# Patient Record
Sex: Male | Born: 1955
Health system: Southern US, Community
[De-identification: ages and names within clinical notes are randomized; demographics above are authoritative.]

## PROBLEM LIST (undated history)

## (undated) DIAGNOSIS — E785 Hyperlipidemia, unspecified: Secondary | ICD-10-CM

## (undated) DIAGNOSIS — M1990 Unspecified osteoarthritis, unspecified site: Secondary | ICD-10-CM

## (undated) DIAGNOSIS — M2550 Pain in unspecified joint: Secondary | ICD-10-CM

## (undated) DIAGNOSIS — Z87442 Personal history of urinary calculi: Secondary | ICD-10-CM

## (undated) DIAGNOSIS — R7303 Prediabetes: Secondary | ICD-10-CM

## (undated) DIAGNOSIS — U071 COVID-19: Secondary | ICD-10-CM

## (undated) DIAGNOSIS — F419 Anxiety disorder, unspecified: Secondary | ICD-10-CM

## (undated) DIAGNOSIS — I10 Essential (primary) hypertension: Secondary | ICD-10-CM

## (undated) DIAGNOSIS — K219 Gastro-esophageal reflux disease without esophagitis: Secondary | ICD-10-CM

## (undated) DIAGNOSIS — N200 Calculus of kidney: Secondary | ICD-10-CM

## (undated) DIAGNOSIS — I519 Heart disease, unspecified: Secondary | ICD-10-CM

## (undated) HISTORY — DX: Essential (primary) hypertension: I10

## (undated) HISTORY — PX: COLONOSCOPY: SHX174

## (undated) HISTORY — DX: Pain in unspecified joint: M25.50

## (undated) HISTORY — DX: Calculus of kidney: N20.0

## (undated) HISTORY — DX: Unspecified osteoarthritis, unspecified site: M19.90

## (undated) HISTORY — DX: Heart disease, unspecified: I51.9

## (undated) HISTORY — DX: Gastro-esophageal reflux disease without esophagitis: K21.9

## (undated) HISTORY — DX: Hyperlipidemia, unspecified: E78.5

---

## 1996-04-25 HISTORY — PX: UPPER GI ENDOSCOPY: SHX6162

## 2009-07-15 ENCOUNTER — Ambulatory Visit: Payer: Self-pay | Admitting: General Practice

## 2009-07-26 ENCOUNTER — Ambulatory Visit: Payer: Self-pay | Admitting: General Practice

## 2009-07-26 DIAGNOSIS — M47814 Spondylosis without myelopathy or radiculopathy, thoracic region: Secondary | ICD-10-CM | POA: Insufficient documentation

## 2009-11-09 ENCOUNTER — Encounter: Payer: Self-pay | Admitting: Neurosurgery

## 2009-11-27 ENCOUNTER — Encounter: Payer: Self-pay | Admitting: Neurosurgery

## 2009-12-28 ENCOUNTER — Encounter: Payer: Self-pay | Admitting: Neurosurgery

## 2011-12-16 ENCOUNTER — Ambulatory Visit: Payer: Self-pay | Admitting: General Practice

## 2011-12-16 LAB — CREATININE, SERUM
Creatinine: 1.01 mg/dL (ref 0.60–1.30)
EGFR (African American): >60
EGFR (Non-African Amer.): >60

## 2011-12-19 ENCOUNTER — Ambulatory Visit: Payer: Self-pay | Admitting: General Practice

## 2012-06-22 ENCOUNTER — Ambulatory Visit: Payer: Self-pay | Admitting: Gastroenterology

## 2013-11-07 IMAGING — CT CT ANGIOGRAPHY HEAD
1 series · 1 of 1 positions shown · non-contrast
Comparison: none

REASON FOR EXAM: LABS 1ST abn MRIs headaches occipital follow up possible
aneurysm right ante...
COMMENTS:

[Series 7: bw · 1 of 1 slices shown]
[im 1/1]
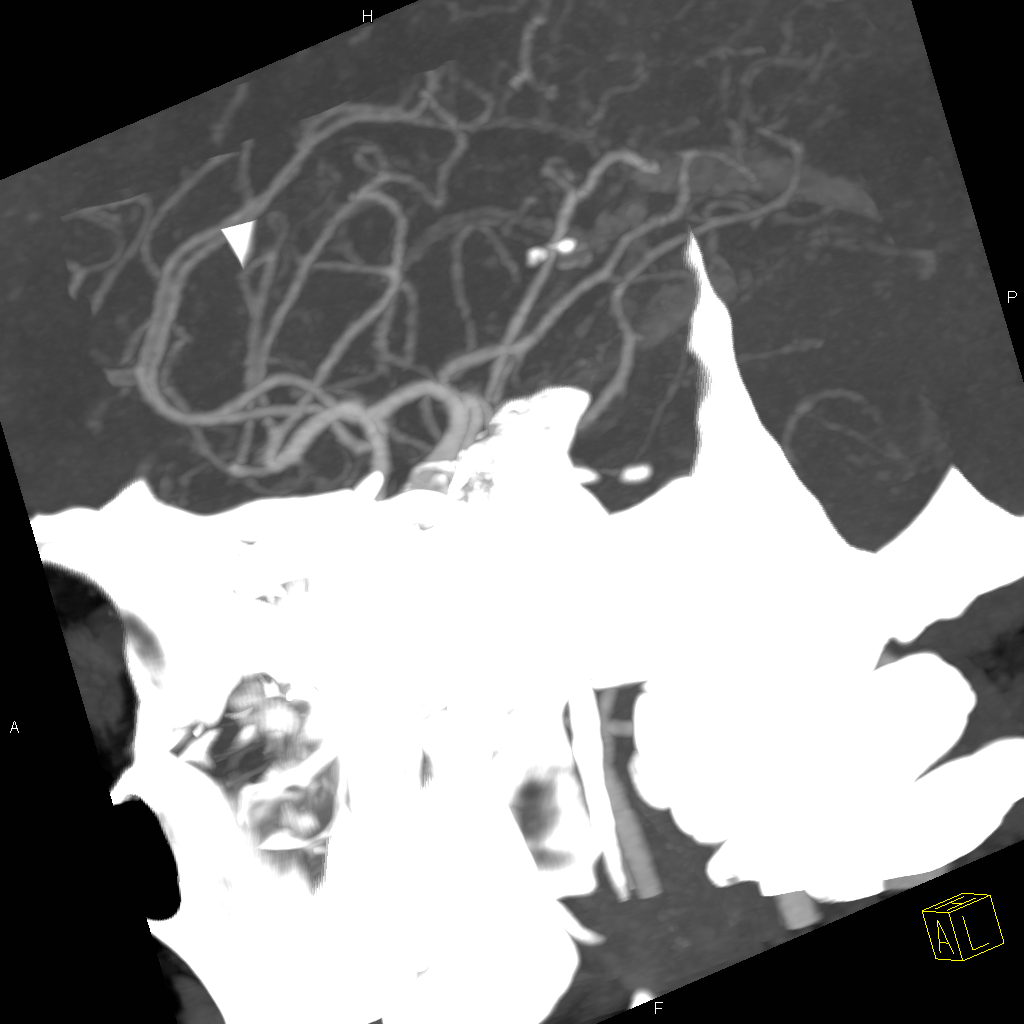

[1 of 1 positions shown; findings below may reference images not displayed]

PROCEDURE:     KCT - KCT ANGIOGRAPHY HEAD W/WO  - December 19, 2011  [DATE]

RESULT:     CT angiography was performed through the brain following
administration of 100 cc of Isovue 370. Multiplanar imaging review performed
on the VIA monitor.

The A1 segment of the right anterior cerebral artery is hypoplastic as
previously demonstrated on the MRI of 16 December, 2011. The right A2 segment
originates from the distal aspect of the left A1 segment. At the origin
there is no evidence of an aneurysm. The origin of the left A2 segment
appears normal. Elsewhere the intracranial arterial system exhibits no
evidence of aneurysm. Once again the left posterior communicating artery is
demonstrated but the right posterior communicating artery is not clearly
evident. The basilar artery appears patent. The distal internal carotid
arteries are normal in appearance.
IMPRESSION: There is no definite evidence of an aneurysm at the origin
of the A2 segment of the right anterior cerebral artery. This artery arises
from the left A1 trunk artery as does the normal-appearing left A2 segment.

## 2014-07-15 ENCOUNTER — Encounter: Payer: Self-pay | Admitting: *Deleted

## 2014-07-28 ENCOUNTER — Encounter: Payer: Self-pay | Admitting: General Surgery

## 2014-07-28 ENCOUNTER — Ambulatory Visit (INDEPENDENT_AMBULATORY_CARE_PROVIDER_SITE_OTHER): Payer: BLUE CROSS/BLUE SHIELD | Admitting: General Surgery

## 2014-07-28 VITALS — BP 124/70 | HR 78 | Resp 14 | Ht 74.0 in | Wt 239.0 lb

## 2014-07-28 DIAGNOSIS — K429 Umbilical hernia without obstruction or gangrene: Secondary | ICD-10-CM

## 2014-07-28 NOTE — Progress Notes (Signed)
Patient ID: Jose HallKevin B Spera, male   DOB: [birth year], 59 y.o.   MRN: [id number]  Chief Complaint  Patient presents with  . Other    evaluation for umbilical hernia    HPI Jose HolmKevin B Crispo is a 59 y.o. male who presents for an evaluation of an umbilical hernia. The patient states the hernia has been present for approximately 2-3 years. He states he has a burning sensation that comes and goes on a daily basis. He notices it when he bends. No GI issues. He hasn't noticed much of a change in size over time, although he is more aware of the area in the last 6 months when he bends at the waist.  HPI  Past Medical History  Diagnosis Date  . Hypertension   . Hyperlipidemia   . Arthritis   . Joint pain   . Kidney stone   . Heart disease   . GERD (gastroesophageal reflux disease)     Past Surgical History  Procedure Laterality Date  . Upper gi endoscopy  04/25/1996  . Colonoscopy  2013-2014    Family History  Problem Relation Age of Onset  . Diabetes Mother   . Heart disease Father     Social History History  Substance Use Topics  . Smoking status: Never Smoker   . Smokeless tobacco: Never Used  . Alcohol Use: Yes    No Known Allergies  Current Outpatient Prescriptions  Medication Sig Dispense Refill  . atorvastatin (LIPITOR) 20 MG tablet Take 20 mg by mouth daily.    Marland Kitchen. lisinopril-hydrochlorothiazide (PRINZIDE,ZESTORETIC) 20-25 MG per tablet Take 1 tablet by mouth daily.    . meloxicam (MOBIC) 15 MG tablet Take 15 mg by mouth daily as needed for pain.    . Multiple Vitamin (MULTIVITAMIN) tablet Take 1 tablet by mouth daily.    Marland Kitchen. omeprazole (PRILOSEC) 20 MG capsule Take 20 mg by mouth daily.    Marland Kitchen. terbinafine (LAMISIL) 250 MG tablet Take 250 mg by mouth daily.     No current facility-administered medications for this visit.    Review of Systems Review of Systems  Constitutional: Negative.   Respiratory: Negative.   Cardiovascular: Negative.     Blood pressure 124/70, pulse  78, resp. rate 14, height 6\' 2"  (1.88 m), weight 239 lb (108.41 kg).  Physical Exam Physical Exam  Constitutional: He is oriented to person, place, and time. He appears well-developed and well-nourished.  Cardiovascular: Normal rate, regular rhythm and normal heart sounds.   No murmur heard. Pulmonary/Chest: Effort normal and breath sounds normal.  Abdominal: Soft. Normal appearance and bowel sounds are normal. There is no hepatosplenomegaly. There is no tenderness. A hernia (1 cm defect reducible umbilical hernia present.) is present.  Neurological: He is alert and oriented to person, place, and time.  Skin: Skin is warm and dry.    Data Reviewed PCP notes of 11 9 and 04/09/2014.  Laboratory studies dated November and 9, 2015 showed normal electrolytes and creatinine of 0.9 with an estimated GFR of 95. Elevated serum triglycerides. PSA normal at 1.8. Hemoglobin 16.7. White blood cell, 4300. Platelets low at 136,000.    Assessment    Umbilical hernia, modestly symptomatic. Mild thrombocytopenia.    Plan    [facility] We will recheck his platelet count prior to surgery.    PCP:  Leigh AuroraStrickland, James   Roye Gustafson W 07/29/2014, 8:31 AM

## 2014-07-28 NOTE — Patient Instructions (Addendum)
Patient to be scheduled for umbilical hernia repair. The patient is aware to call back for any questions or concerns.  Open Hernia Repair Open hernia repair is surgery to fix a hernia. A hernia occurs when an internal organ or tissue pushes out through a weak spot in the abdominal wall muscles. Hernias commonly occur in the groin and around the navel. Most hernias tend to get worse over time. Surgery is often done to prevent the hernia from getting bigger, becoming uncomfortable, or becoming an emergency. Emergency surgery Krizan be needed if abdominal contents get stuck in the opening (incarcerated hernia) or the blood supply gets cut off (strangulated hernia). In an open repair, a large cut (incision) is made in the abdomen to perform the surgery. LET [facility] CARE PROVIDER KNOW ABOUT:  Any allergies you have.  All medicines you are taking, including vitamins, herbs, eye drops, creams, and over-the-counter medicines.  Previous problems you or members of your family have had with the use of anesthetics.  Any blood disorders you have.  Previous surgeries you have had.  Medical conditions you have. RISKS AND COMPLICATIONS Generally, this is a safe procedure. However, as with any procedure, complications can occur. Possible complications include:  Infection.  Bleeding.  Nerve injury.  Chronic pain.  The hernia can come back.  Injury to the intestines. BEFORE THE PROCEDURE  Ask your health care provider about changing or stopping any regular medicines. Avoid taking aspirin or blood thinners as directed by your health care provider.  Do noteat or drink anything after midnight the night before surgery.  If you smoke, do not smoke for at least 2 weeks before your surgery.  Do not drink alcohol the day before your surgery.  Let your health care provider know if you develop a cold or any infection before your surgery.  Arrange for someone to drive you home after the procedure or  after your hospital stay. Also arrange for someone to help you with activities during recovery. PROCEDURE   Small monitors will be put on your body. They are used to check your heart, blood pressure, and oxygen level.   An IV access tube will be put into one of your veins. Medicine will be able to flow directly into your body through this IV tube.   You might be given a medicine to help you relax (sedative).   You will be given a medicine to make you sleep (general anesthetic). A breathing tube Ruperto be placed into your lungs during the procedure.  A cut (incision) is made over the hernia defect, and the contents are pushed back into the abdomen.  If the hernia is small, stitches Wyrick be used to bring the muscle edges back together.  Typically, a surgeon will place a mesh patch made of man-made material (synthetic) to cover the defect. The mesh is sewn to healthy muscle. This reduces the risk of the hernia coming back.  The tissue and skin over the hernia are then closed with stitches or staples.  If the hernia was large, a drain Hay be left in place to collect excess fluid where the hernia used to be.  Bandages (dressings) are used to cover the incision. AFTER THE PROCEDURE  You will be taken to a recovery area where your progress will be monitored.  If the hernia was small or in the groin (inguinal) region, you will likely be allowed to go home once you are awake, stable, and taking fluids well.  If the hernia  was large, you Mclucas have to wait for your bowel function to return. You Ingham need to stay in the hospital for 2-3 days until you can eat and your pain is controlled. A drain Wiltgen be left in place for 5-7 days. You will be taught how to care for the drain. Document Released: 11/09/2000 Document Revised: 03/06/2013 Document Reviewed: 12/26/2012 [facility] Patient Information 2015 [employer] [place]. This information is not intended to replace advice given to you by your health care  provider. Make sure you discuss any questions you have with your health care provider.  [facility].

## 2014-07-29 ENCOUNTER — Other Ambulatory Visit: Payer: Self-pay | Admitting: General Surgery

## 2014-07-29 DIAGNOSIS — K429 Umbilical hernia without obstruction or gangrene: Secondary | ICD-10-CM | POA: Insufficient documentation

## 2014-08-11 ENCOUNTER — Ambulatory Visit: Payer: Self-pay

## 2014-08-11 ENCOUNTER — Encounter: Payer: Self-pay | Admitting: General Surgery

## 2014-08-14 ENCOUNTER — Ambulatory Visit: Payer: Self-pay | Admitting: General Surgery

## 2014-08-14 DIAGNOSIS — K429 Umbilical hernia without obstruction or gangrene: Secondary | ICD-10-CM | POA: Diagnosis not present

## 2014-08-14 HISTORY — PX: HERNIA REPAIR: SHX51

## 2014-08-15 ENCOUNTER — Encounter: Payer: Self-pay | Admitting: General Surgery

## 2014-08-15 ENCOUNTER — Telehealth: Payer: Self-pay | Admitting: *Deleted

## 2014-08-15 NOTE — Telephone Encounter (Signed)
Pt called and wanted to know if he could ride all the way to [place] because he had a death in the family. He had umbilical hernia yesterday (08/14/14). Spoke with Shanda BumpsJessica and she said for him to get out about every 30-45 minutes to walk.

## 2014-08-21 ENCOUNTER — Ambulatory Visit (INDEPENDENT_AMBULATORY_CARE_PROVIDER_SITE_OTHER): Payer: BLUE CROSS/BLUE SHIELD | Admitting: General Surgery

## 2014-08-21 ENCOUNTER — Encounter: Payer: Self-pay | Admitting: General Surgery

## 2014-08-21 VITALS — BP 142/74 | HR 76 | Resp 12 | Ht 74.0 in | Wt 239.0 lb

## 2014-08-21 DIAGNOSIS — K429 Umbilical hernia without obstruction or gangrene: Secondary | ICD-10-CM

## 2014-08-21 NOTE — Patient Instructions (Addendum)
Proper lifting techniques reviewed.Patient to return in two weeks.  

## 2014-08-21 NOTE — Progress Notes (Signed)
Patient ID: Jose Moody, male   DOB: [birth year], 59 y.o.   MRN: [id number]  Chief Complaint  Patient presents with  . Routine Post Op    umbilical hernia repair    HPI Jose Moody is a 59 y.o. here today for his post op umbilical hernia repair done on 08/14/14. Patient states he is doing well.  HPI  Past Medical History  Diagnosis Date  . Hypertension   . Hyperlipidemia   . Arthritis   . Joint pain   . Kidney stone   . Heart disease   . GERD (gastroesophageal reflux disease)     Past Surgical History  Procedure Laterality Date  . Upper gi endoscopy  04/25/1996  . Colonoscopy  2013-2014  . Hernia repair  08/14/14     umbilical hernia    Family History  Problem Relation Age of Onset  . Diabetes Mother   . Heart disease Father     Social History History  Substance Use Topics  . Smoking status: Never Smoker   . Smokeless tobacco: Never Used  . Alcohol Use: Yes    No Known Allergies  Current Outpatient Prescriptions  Medication Sig Dispense Refill  . atorvastatin (LIPITOR) 20 MG tablet Take 20 mg by mouth daily.    Marland Kitchen. lisinopril-hydrochlorothiazide (PRINZIDE,ZESTORETIC) 20-25 MG per tablet Take 1 tablet by mouth daily.    . meloxicam (MOBIC) 15 MG tablet Take 15 mg by mouth daily as needed for pain.    . Multiple Vitamin (MULTIVITAMIN) tablet Take 1 tablet by mouth daily.    Marland Kitchen. omeprazole (PRILOSEC) 20 MG capsule Take 20 mg by mouth daily.    Marland Kitchen. terbinafine (LAMISIL) 250 MG tablet Take 250 mg by mouth daily.     No current facility-administered medications for this visit.    Review of Systems Review of Systems  Constitutional: Negative.   Respiratory: Negative.   Cardiovascular: Negative.     Blood pressure 142/74, pulse 76, resp. rate 12, height 6\' 2"  (1.88 m), weight 239 lb (108.41 kg).  Physical Exam Physical Exam  Constitutional: He is oriented to person, place, and time. He appears well-developed and well-nourished.  Abdominal: Soft. Normal appearance.     Umbilical hernia incision is clean and healing well.   Neurological: He is alert and oriented to person, place, and time.  Skin: Skin is warm and dry.      Assessment    Study progress status post primary repair of small umbilical defect.    Plan    The patient op rate 70 equipment. Plan for a reexamination in 2 weeks. In the interval the patient will increase his activity as tolerated. Proper lifting technique was reviewed. Anticipate return to work in 3 weeks.     PCP:  Jose Moody  Louvenia Golomb W 08/21/2014, 10:14 AM

## 2014-09-04 ENCOUNTER — Encounter: Payer: Self-pay | Admitting: General Surgery

## 2014-09-04 ENCOUNTER — Ambulatory Visit (INDEPENDENT_AMBULATORY_CARE_PROVIDER_SITE_OTHER): Payer: BLUE CROSS/BLUE SHIELD | Admitting: General Surgery

## 2014-09-04 VITALS — BP 130/70 | HR 82 | Resp 14 | Ht 74.0 in | Wt 239.0 lb

## 2014-09-04 DIAGNOSIS — K429 Umbilical hernia without obstruction or gangrene: Secondary | ICD-10-CM

## 2014-09-04 NOTE — Patient Instructions (Signed)
Patient to return as needed. 

## 2014-09-04 NOTE — Progress Notes (Signed)
Patient ID: Doloris HallKevin B Moody, male   DOB: 10/26/1955, 59 y.o.   MRN: [id number]  Chief Complaint  Patient presents with  . Routine Post Op    umbilical hernia    HPI Jose Moody is a 59 y.o. male . here today for his post op umbilical hernia repair done on 08/14/14. Patient states he is doing well.  HPI  Past Medical History  Diagnosis Date  . Hypertension   . Hyperlipidemia   . Arthritis   . Joint pain   . Kidney stone   . Heart disease   . GERD (gastroesophageal reflux disease)     Past Surgical History  Procedure Laterality Date  . Upper gi endoscopy  04/25/1996  . Colonoscopy  2013-2014  . Hernia repair  08/14/14     umbilical hernia    Family History  Problem Relation Age of Onset  . Diabetes Mother   . Heart disease Father     Social History History  Substance Use Topics  . Smoking status: Never Smoker   . Smokeless tobacco: Never Used  . Alcohol Use: Yes    No Known Allergies  Current Outpatient Prescriptions  Medication Sig Dispense Refill  . atorvastatin (LIPITOR) 20 MG tablet Take 20 mg by mouth daily.    Marland Kitchen. lisinopril-hydrochlorothiazide (PRINZIDE,ZESTORETIC) 20-25 MG per tablet Take 1 tablet by mouth daily.    . meloxicam (MOBIC) 15 MG tablet Take 15 mg by mouth daily as needed for pain.    . Multiple Vitamin (MULTIVITAMIN) tablet Take 1 tablet by mouth daily.    Marland Kitchen. omeprazole (PRILOSEC) 20 MG capsule Take 20 mg by mouth daily.    Marland Kitchen. terbinafine (LAMISIL) 250 MG tablet Take 250 mg by mouth daily.     No current facility-administered medications for this visit.    Review of Systems Review of Systems  Constitutional: Negative.   Respiratory: Negative.   Cardiovascular: Negative.     Blood pressure 130/70, pulse 82, resp. rate 14, height 6\' 2"  (1.88 m), weight 239 lb (108.41 kg).  Physical Exam Physical Exam  Constitutional: He appears well-developed and well-nourished.  Eyes: Conjunctivae are normal. No scleral icterus.  Neck: Neck supple.   Abdominal:  Umbilical hernia looks clean and healing well.   Lymphadenopathy:    He has no cervical adenopathy.  Neurological: He is alert.  Skin: Skin is dry.     Assessment    Doing well status post umbilical hernia repair.    Plan    Patient to return as needed. [name] return back to work on 09/08/14. Care was strenuous lifting was encouraged. Proper lifting technique demonstrated. No restrictions.     PCP:  Leigh AuroraStrickland, James   Munir Victorian W 09/05/2014, 9:54 PM

## 2014-09-28 NOTE — Op Note (Signed)
PATIENT NAME:  Jose Moody, Jose Moody MR#:  [id number] DATE OF BIRTH:  November 19, 1955  DATE OF PROCEDURE:  08/14/2014  PREOPERATIVE DIAGNOSIS: Symptomatic umbilical hernia.   POSTOPERATIVE DIAGNOSIS: Symptomatic umbilical hernia.  PROCEDURE PERFORMED: Repair of umbilical hernia.   SURGEON: Earline MayotteJeffrey W. Denaly Gatling, MD   ANESTHESIA: General by LMA; Marcaine 0.5% plain, 30 mL local infiltration.   ESTIMATED BLOOD LOSS: Minimal.   CLINICAL NOTE: This 59 year old male has developed a slowly enlarging umbilical hernia. The site is symptomatic with direct pressure. He was admitted for elective repair.   OPERATIVE NOTE: Hair had previously been removed with clippers. Local anesthetic was infiltrated for postoperative analgesia. An infraumbilical incision was made and carried down through the skin and subcutaneous tissue with hemostasis achieved by electrocautery. The hernia sac was dissected free from the overlying umbilical skin and transected at the fascial layer with electrocautery. The undersurface of the fascia was cleared. The fascial defect was less than 2 cm in diameter. This was closed with interrupted 0 Surgilon sutures. The umbilical skin was tacked to the fascia with 3-0 Vicryl figure-of-eight suture. The adipose layer was approximated with running 3-0 Vicryl. The skin was closed with a running 4-0 Vicryl subcuticular suture. Benzoin, Steri-Strips, Telfa, and Tegaderm dressings were then applied. The patient tolerated the procedure well and was taken to the recovery room in stable condition.    ____________________________ Earline MayotteJeffrey W. Donette Mainwaring, MD jwb:bm D: 08/14/2014 21:16:43 ET T: 08/14/2014 22:24:39 ET JOB#: [id number]  cc: Earline MayotteJeffrey W. Kian Ottaviano, MD, <Dictator> Quita SkyeJames D. Dorothey BasemanStrickland, MD Charish Schroepfer Brion AlimentW Abigayl Hor MD ELECTRONICALLY SIGNED 08/16/2014 11:56

## 2015-07-20 ENCOUNTER — Other Ambulatory Visit: Payer: Self-pay | Admitting: Physician Assistant

## 2015-12-23 ENCOUNTER — Other Ambulatory Visit: Payer: Self-pay | Admitting: Physician Assistant

## 2016-01-12 ENCOUNTER — Other Ambulatory Visit: Payer: Self-pay | Admitting: Physician Assistant

## 2016-01-25 ENCOUNTER — Other Ambulatory Visit: Payer: Self-pay | Admitting: Physician Assistant

## 2016-07-14 ENCOUNTER — Other Ambulatory Visit: Payer: Self-pay | Admitting: Physician Assistant

## 2016-08-29 ENCOUNTER — Emergency Department: Payer: BLUE CROSS/BLUE SHIELD

## 2016-08-29 ENCOUNTER — Encounter: Payer: Self-pay | Admitting: *Deleted

## 2016-08-29 ENCOUNTER — Emergency Department
Admission: EM | Admit: 2016-08-29 | Discharge: 2016-08-30 | Disposition: A | Discharge status: Home / Self Care | Payer: BLUE CROSS/BLUE SHIELD | Attending: Emergency Medicine | Admitting: Emergency Medicine

## 2016-08-29 DIAGNOSIS — R109 Unspecified abdominal pain: Secondary | ICD-10-CM

## 2016-08-29 DIAGNOSIS — R319 Hematuria, unspecified: Secondary | ICD-10-CM

## 2016-08-29 DIAGNOSIS — I10 Essential (primary) hypertension: Secondary | ICD-10-CM | POA: Diagnosis not present

## 2016-08-29 DIAGNOSIS — Z79899 Other long term (current) drug therapy: Secondary | ICD-10-CM | POA: Insufficient documentation

## 2016-08-29 DIAGNOSIS — N200 Calculus of kidney: Secondary | ICD-10-CM | POA: Insufficient documentation

## 2016-08-29 LAB — CBC
HEMATOCRIT: 49.7 % (ref 40.0–52.0)
Hemoglobin: 17.1 g/dL (ref 13.0–18.0)
MCH: 31.2 pg (ref 26.0–34.0)
MCHC: 34.4 g/dL (ref 32.0–36.0)
MCV: 90.6 fL (ref 80.0–100.0)
PLATELETS: 136 10*3/uL — AB (ref 150–440)
RBC: 5.48 MIL/uL (ref 4.40–5.90)
RDW: 13.5 % (ref 11.5–14.5)
WBC: 14.6 10*3/uL — AB (ref 3.8–10.6)

## 2016-08-29 LAB — BASIC METABOLIC PANEL
ANION GAP: 10 (ref 5–15)
BUN: 24 mg/dL — ABNORMAL HIGH (ref 6–20)
CO2: 24 mmol/L (ref 22–32)
Calcium: 10 mg/dL (ref 8.9–10.3)
Chloride: 104 mmol/L (ref 101–111)
Creatinine, Ser: 1.61 mg/dL — ABNORMAL HIGH (ref 0.61–1.24)
GFR, EST AFRICAN AMERICAN: 53 mL/min — AB (ref >60)
GFR, EST NON AFRICAN AMERICAN: 46 mL/min — AB (ref >60)
GLUCOSE: 121 mg/dL — AB (ref 65–99)
POTASSIUM: 4.6 mmol/L (ref 3.5–5.1)
Sodium: 138 mmol/L (ref 135–145)

## 2016-08-29 LAB — URINALYSIS, COMPLETE (UACMP) WITH MICROSCOPIC
Bacteria, UA: NONE SEEN
Bilirubin Urine: NEGATIVE
GLUCOSE, UA: NEGATIVE mg/dL
Ketones, ur: NEGATIVE mg/dL
LEUKOCYTES UA: NEGATIVE
Nitrite: NEGATIVE
PH: 7 (ref 5.0–8.0)
PROTEIN: NEGATIVE mg/dL
SQUAMOUS EPITHELIAL / LPF: NONE SEEN
Specific Gravity, Urine: 1.016 (ref 1.005–1.030)

## 2016-08-29 MED ORDER — ONDANSETRON HCL 4 MG/2ML IJ SOLN
4.0000 mg | Freq: Once | INTRAMUSCULAR | Status: AC
  Administered 2016-08-29: 4 mg via INTRAVENOUS

## 2016-08-29 MED ORDER — HYDROMORPHONE HCL 1 MG/ML IJ SOLN
1.0000 mg | INTRAMUSCULAR | Status: AC
  Administered 2016-08-29: 1 mg via INTRAVENOUS
  Filled 2016-08-29: qty 1

## 2016-08-29 MED ORDER — IOPAMIDOL (ISOVUE-370) INJECTION 76%
100.0000 mL | Freq: Once | INTRAVENOUS | Status: AC | PRN
  Administered 2016-08-29: 100 mL via INTRAVENOUS

## 2016-08-29 MED ORDER — SODIUM CHLORIDE 0.9 % IV BOLUS (SEPSIS)
1000.0000 mL | Freq: Once | INTRAVENOUS | Status: AC
  Administered 2016-08-29: 1000 mL via INTRAVENOUS

## 2016-08-29 MED ORDER — MORPHINE SULFATE (PF) 4 MG/ML IV SOLN
4.0000 mg | Freq: Once | INTRAVENOUS | Status: AC
  Administered 2016-08-29: 4 mg via INTRAVENOUS

## 2016-08-29 MED ORDER — ONDANSETRON HCL 4 MG/2ML IJ SOLN
4.0000 mg | Freq: Once | INTRAMUSCULAR | Status: AC
  Administered 2016-08-29: 4 mg via INTRAVENOUS
  Filled 2016-08-29: qty 2

## 2016-08-29 MED ORDER — MORPHINE SULFATE (PF) 4 MG/ML IV SOLN
INTRAVENOUS | Status: AC
  Administered 2016-08-29: 4 mg via INTRAVENOUS
  Filled 2016-08-29: qty 1

## 2016-08-29 MED ORDER — ONDANSETRON HCL 4 MG/2ML IJ SOLN
INTRAMUSCULAR | Status: AC
  Administered 2016-08-29: 4 mg via INTRAVENOUS
  Filled 2016-08-29: qty 2

## 2016-08-29 NOTE — ED Provider Notes (Signed)
[facility] Emergency Department Provider Note   ____________________________________________   First MD Initiated Contact with Patient 08/29/16 2231     (approximate)  I have reviewed the triage vital signs and the nursing notes.   HISTORY  Chief Complaint Flank Pain    HPI Jose Moody is a 61 y.o. male reports that 6 PM had sudden onset of severe pain in the right lower quadrant which radiates up towards his right flank. Associated with nausea vomiting and severe 10 out 10 pain. Reports he cannot comfortable. Does feel somewhat similar to previous kidney stone, but denies any trouble urinating, blood in his urine, or burning with urination. He's also had a previous umbilical hernia repair, but does not see any swelling or pain in that area.  Denies any testicular or groin pain, except the pain seems to radiate out somewhat from his right flank towards the groin.  No chest pain or shortness of breath. No numbness tingling or weakness in the legs. No cold or blue feet.   Past Medical History:  Diagnosis Date  . Arthritis   . GERD (gastroesophageal reflux disease)   . Heart disease   . Hyperlipidemia   . Hypertension   . Joint pain   . Kidney stone     Patient Active Problem List   Diagnosis Date Noted  . Umbilical hernia without obstruction and without gangrene 07/29/2014    Past Surgical History:  Procedure Laterality Date  . COLONOSCOPY  2013-2014  . HERNIA REPAIR  08/14/14    umbilical hernia  . UPPER GI ENDOSCOPY  04/25/1996    Prior to Admission medications   Medication Sig Start Date End Date Taking? Authorizing Provider  atorvastatin (LIPITOR) 20 MG tablet Take 20 mg by mouth daily.    Historical Provider, MD  lisinopril-hydrochlorothiazide (PRINZIDE,ZESTORETIC) 20-25 MG per tablet Take 1 tablet by mouth daily.    Historical Provider, MD  meloxicam (MOBIC) 15 MG tablet Take 15 mg by mouth daily as needed for pain.    Historical  Provider, MD  Multiple Vitamin (MULTIVITAMIN) tablet Take 1 tablet by mouth daily.    Historical Provider, MD  omeprazole (PRILOSEC) 20 MG capsule Take 20 mg by mouth daily.    Historical Provider, MD  terbinafine (LAMISIL) 250 MG tablet Take 250 mg by mouth daily.    Historical Provider, MD    Allergies Patient has no known allergies.  Family History  Problem Relation Age of Onset  . Diabetes Mother   . Heart disease Father     Social History Social History  Substance Use Topics  . Smoking status: Never Smoker  . Smokeless tobacco: Never Used  . Alcohol use Yes    Review of Systems Constitutional: No fever/chills Eyes: No visual changes. ENT: No sore throat. Cardiovascular: Denies chest pain. Respiratory: Denies shortness of breath. Gastrointestinal: No diarrhea.  No constipation. Genitourinary: Negative for dysuria. Musculoskeletal: Negative for back painExcept some pain around his "right kidney". Skin: Negative for rash. Neurological: Negative for headaches, focal weakness or numbness.  10-point ROS otherwise negative.  ____________________________________________   PHYSICAL EXAM:  VITAL SIGNS: ED Triage Vitals  Enc Vitals Group     BP 08/29/16 2200 (!) 180/95     Pulse Rate 08/29/16 2151 77     Resp 08/29/16 2151 20     Temp 08/29/16 2151 99.6 F (37.6 C)     Temp Source 08/29/16 2151 Oral     SpO2 08/29/16 2151 100 %  Weight 08/29/16 2152 243 lb (110.2 kg)     Height 08/29/16 2152  (1.88 m)     Head Circumference --      Peak Flow --      Pain Score 08/29/16 2151 10     Pain Loc --      Pain Edu? --      Excl. in GC? --     Constitutional: Alert and oriented. Somewhat writhing in the bed, unable to comfortable. Does appear in significant pain. Eyes: Conjunctivae are normal. PERRL. EOMI. Head: Atraumatic. Nose: No congestion/rhinnorhea. Mouth/Throat: Mucous membranes are moist.  Oropharynx non-erythematous. Neck: No stridor.     Cardiovascular: Normal rate, regular rhythm. Grossly normal heart sounds.  Good peripheral circulation. Respiratory: Normal respiratory effort.  No retractions. Lungs CTAB. Gastrointestinal: Soft and nontender except in the right flank. He reports moderate tenderness. No CVA tenderness.. No distention. No umbilical hernia. No groin hernia or masses. Testicles nontender, no edema or erythema. Normal penis has not correct. No abdominal bruits.  Musculoskeletal: No lower extremity tenderness nor edema.  No joint effusions. Bilateral palpable dorsalis pedis pulses. Neurologic:  Normal speech and language. No gross focal neurologic deficits are appreciated.  Skin:  Skin is warm, dry and intact. No rash noted. Psychiatric: Mood and affect are normal. Speech and behavior are normal.  ____________________________________________   LABS (all labs ordered are listed, but only abnormal results are displayed)  Labs Reviewed  URINALYSIS, COMPLETE (UACMP) WITH MICROSCOPIC - Abnormal; Notable for the following:       Result Value   Color, Urine YELLOW (*)    APPearance HAZY (*)    Hgb urine dipstick MODERATE (*)    All other components within normal limits  CBC - Abnormal; Notable for the following:    WBC 14.6 (*)    Platelets 136 (*)    All other components within normal limits  BASIC METABOLIC PANEL - Abnormal; Notable for the following:    Glucose, Bld 121 (*)    BUN 24 (*)    Creatinine, Ser 1.61 (*)    GFR calc non Af Amer 46 (*)    GFR calc Af Amer 53 (*)    All other components within normal limits  TROPONIN I  HEPATIC FUNCTION PANEL  LIPASE, BLOOD   ____________________________________________  EKG  Reviewed and interpreted by me at 2245 Heart rate 80 QRS 80 QTc 4:30 Normal sinus rhythm, no evidence of ischemia or ectopy ____________________________________________  RADIOLOGY   ____________________________________________   PROCEDURES  Procedure(s) performed:  None  Procedures  Critical Care performed: No  ____________________________________________   INITIAL IMPRESSION / ASSESSMENT AND PLAN / ED COURSE  Pertinent labs & imaging results that were available during my care of the patient were reviewed by me and considered in my medical decision making (see chart for details).  Differential diagnosis includes but is not limited to, abdominal perforation, aortic dissection, cholecystitis, appendicitis, diverticulitis, colitis, esophagitis/gastritis, kidney stone, pyelonephritis, urinary tract infection, aortic aneurysm. All are considered in decision and treatment plan. Based upon the patient's presentation and risk factors, and given the rather abrupt onset of severe pain primarily in the right flank and suspicious for kidney stones but given the patient's history of hypertension, smoking I would also consider other etiologies such as dissection, aneurysm, mesenteric ischemia or other acute vascular insult or infectious etiologies.  ----------------------------------------- 10:58 PM on 08/29/2016 -----------------------------------------  The patient is resting, reports he is comfortable at this time. Awake and alert. Did discuss  his labs including elevated white blood count and is kidney function test. 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 Continue IV fluids for hydration, await CT and further evaluation     ----------------------------------------- 11:29 PM on 08/29/2016 -----------------------------------------  Hematuria noted on urinalysis, raising suspicion for nephrolithiasis. Awaiting CT. Ongoing care assigned to Dr. Zenda Alpers, includes follow-up onLFTs, lipase, troponin, and CT abdomen and pelvis.  ____________________________________________   FINAL CLINICAL IMPRESSION(S) / ED DIAGNOSES  Final diagnoses:  Flank pain  Hematuria, unspecified type      NEW MEDICATIONS STARTED DURING  THIS VISIT:  New Prescriptions   No medications on file     Note:  This document was prepared using Dragon voice recognition software and Treml include unintentional dictation errors.     Sharyn Creamer, MD 08/29/16 [phone]

## 2016-08-29 NOTE — ED Triage Notes (Signed)
Pt has a history of kidney stones, pt complains of a sudden onset of right flank pain with nausea, pt vomited once

## 2016-08-29 NOTE — ED Notes (Signed)
Pt to CT via stretcher accomp by CT tech 

## 2016-08-29 NOTE — ED Notes (Signed)
Dr Fanny Bien notified of pt's persistent pain

## 2016-08-30 ENCOUNTER — Telehealth: Payer: Self-pay

## 2016-08-30 ENCOUNTER — Encounter: Payer: BLUE CROSS/BLUE SHIELD | Admitting: Urology

## 2016-08-30 LAB — HEPATIC FUNCTION PANEL
ALBUMIN: 4.7 g/dL (ref 3.5–5.0)
ALT: 42 U/L (ref 17–63)
AST: 53 U/L — ABNORMAL HIGH (ref 15–41)
Alkaline Phosphatase: 53 U/L (ref 38–126)
BILIRUBIN DIRECT: 0.4 mg/dL (ref 0.1–0.5)
BILIRUBIN TOTAL: 1 mg/dL (ref 0.3–1.2)
Indirect Bilirubin: 0.6 mg/dL (ref 0.3–0.9)
Total Protein: 7.6 g/dL (ref 6.5–8.1)

## 2016-08-30 LAB — TROPONIN I

## 2016-08-30 LAB — LIPASE, BLOOD: LIPASE: 20 U/L (ref 11–51)

## 2016-08-30 MED ORDER — OXYCODONE-ACETAMINOPHEN 5-325 MG PO TABS: 1.0000 | ORAL_TABLET | Freq: Four times a day (QID) | ORAL | 0 refills | Status: DC | PRN

## 2016-08-30 MED ORDER — ONDANSETRON 4 MG PO TBDP: 4.0000 mg | ORAL_TABLET | Freq: Three times a day (TID) | ORAL | 0 refills | Status: DC | PRN

## 2016-08-30 MED ORDER — HYDROMORPHONE HCL 1 MG/ML IJ SOLN
1.0000 mg | INTRAMUSCULAR | Status: AC
  Administered 2016-08-30: 1 mg via INTRAVENOUS
  Filled 2016-08-30: qty 1

## 2016-08-30 MED ORDER — TAMSULOSIN HCL 0.4 MG PO CAPS: 0.4000 mg | ORAL_CAPSULE | Freq: Every day | ORAL | 0 refills | Status: DC

## 2016-08-30 MED ORDER — KETOROLAC TROMETHAMINE 30 MG/ML IJ SOLN
30.0000 mg | Freq: Once | INTRAMUSCULAR | Status: AC
  Administered 2016-08-30: 30 mg via INTRAVENOUS
  Filled 2016-08-30: qty 1

## 2016-08-30 NOTE — ED Notes (Signed)
Pt returns from CT; reports pain has returned 10/10; Dr Zenda Alpers notified and med ordered

## 2016-08-30 NOTE — ED Provider Notes (Signed)
-----------------------------------------   1:25 AM on 08/30/2016 -----------------------------------------   Blood pressure (!) 172/99, pulse 81, temperature 99.6 F (37.6 C), temperature source Oral, resp. rate 11, height  (1.88 m), weight 243 lb (110.2 kg), SpO2 94 %.  Assuming care from Dr. Fanny Bien.  In short, Jose Moody is a 61 y.o. male with a chief complaint of Flank Pain .  Refer to the original H&P for additional details.  The current plan of care is to follow up the results of the patient's CT scan.   Clinical Course as of Aug 31 123  Tue Aug 30, 2016  0055 Mild to moderate right hydronephrosis and proximal hydroureter, secondary to a 5 mm stone within the proximal right ureter, several cm past the right UPJ. 1.1 cm hypodense lesion in the mid to upper left kidney with intermediate density values, could be evaluated with nonemergent MRI.   CT Angio Abd/Pel W and/or Wo Contrast [AW]  0124 After returning from CT the patient did receive a second dose of Dilaudid from me. Once I received the results of the CT scan I did give him 30 mg of Toradol. I discussed the results with the patient and informed him that he does have a kidney stone. I will discharge the patient to home and have her follow-up with urology. The patient understands and agrees with the plan.  [AW]    Clinical Course User Index [AW] [name], MD      [name], MD 08/30/16 [phone]

## 2016-08-30 NOTE — Progress Notes (Signed)
This encounter was created in error - please disregard.

## 2016-08-30 NOTE — ED Notes (Signed)
ED Provider at bedside. 

## 2016-08-30 NOTE — Discharge Instructions (Signed)
Please ensure that your drinking increased amounts of water to help pass the kidney stone. Please return with any worsening pain or inability to take her medications. Ulcer return should she have any fevers chills or other infectious symptoms.

## 2016-08-30 NOTE — Telephone Encounter (Signed)
Pt presented today for ER f/u of kidney stone with Dr. Sherryl Barters. Due pt having been d/c from ER at 2:30 this morning pt was not able to be seen. Offered pt next available new pt appt. Pt stated that he would prefer not to be seen and pass the stone with fluids, pain medication, and flomax. Reinforced with pt should he develop further s/s or wish to be seen to call back. Pt voiced understanding of whole conversation.

## 2016-10-13 ENCOUNTER — Other Ambulatory Visit: Payer: Self-pay | Admitting: Physician Assistant

## 2018-07-19 IMAGING — CT CT CTA ABD/PEL W/CM AND/OR W/O CM
3 of 9 series · 11 of 46 positions shown, 17 images · IV contrast (isovue)
Comparison: None.

CLINICAL DATA: Right flank pain with nausea

EXAM:
CTA ABDOMEN AND PELVIS wITHOUT AND WITH CONTRAST
TECHNIQUE: Multidetector CT imaging of the abdomen and pelvis was performed
using the standard protocol during bolus administration of
intravenous contrast. Multiplanar reconstructed images and MIPs were
obtained and reviewed to evaluate the vascular anatomy.
CONTRAST:  100 mL Isovue 370 intravenous

[Series 4: axial arterial · axial · arterial · 0.93mm/px · z∈[-565,-457]mm · 3 of 273 slices shown]
[im 28/273  soft-tissue]
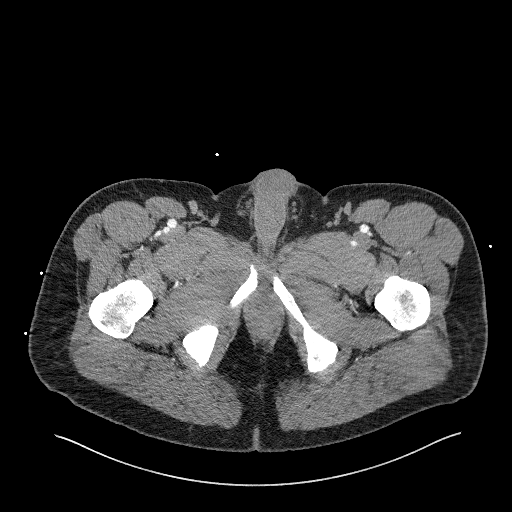
[im 55/273  soft-tissue]
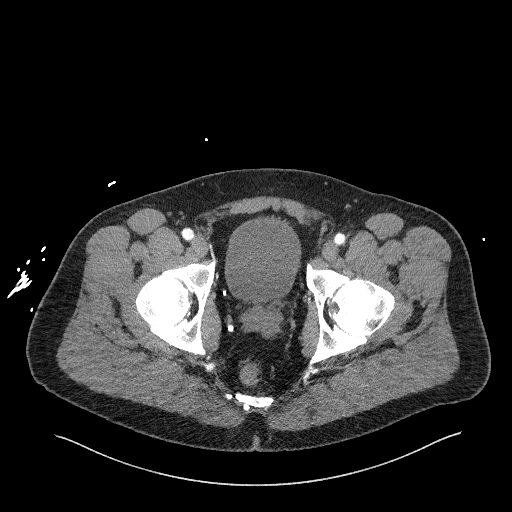
[im 82/273  soft-tissue]
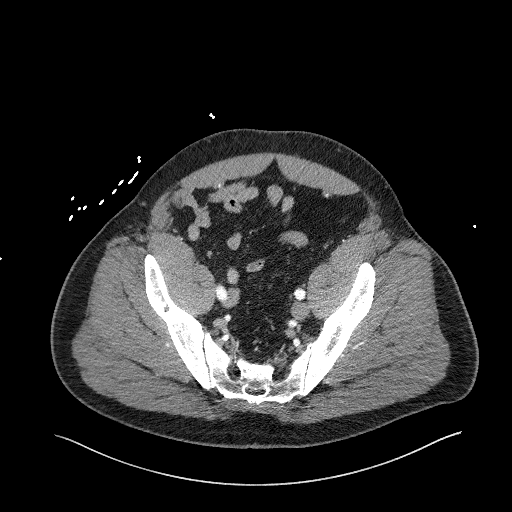

[Series 6: axial venous · axial · portal-venous · 0.93mm/px · z∈[-540,-155]mm · 6 of 109 slices shown, 11 images]
[im 16/109  soft-tissue]
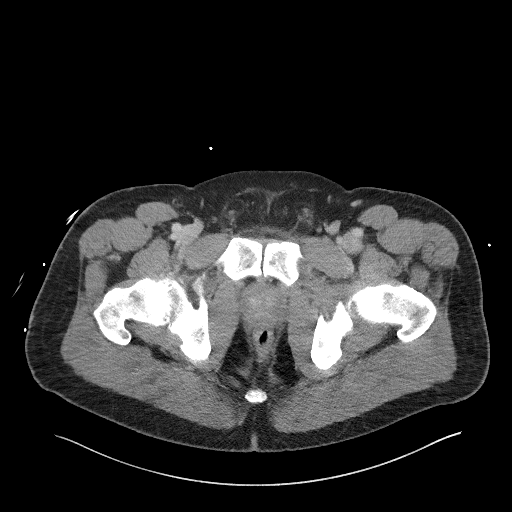
[im 16/109  bone]
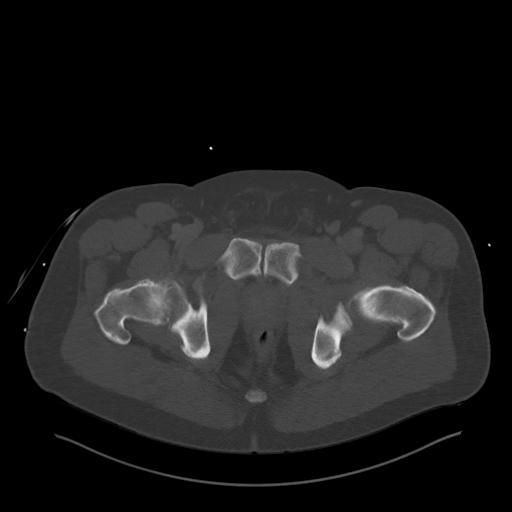
[im 31/109  soft-tissue]
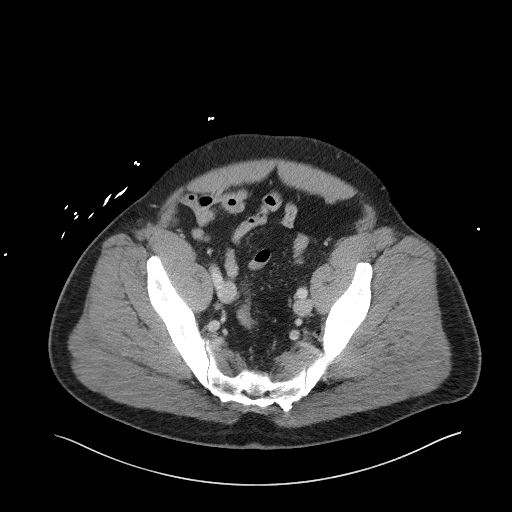
[im 47/109  soft-tissue]
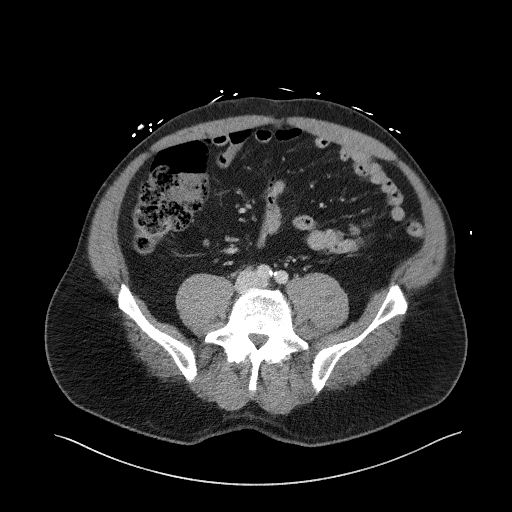
[im 47/109  lung]
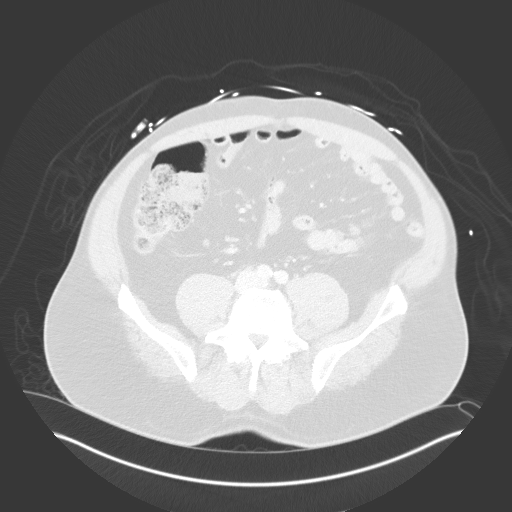
[im 62/109  soft-tissue]
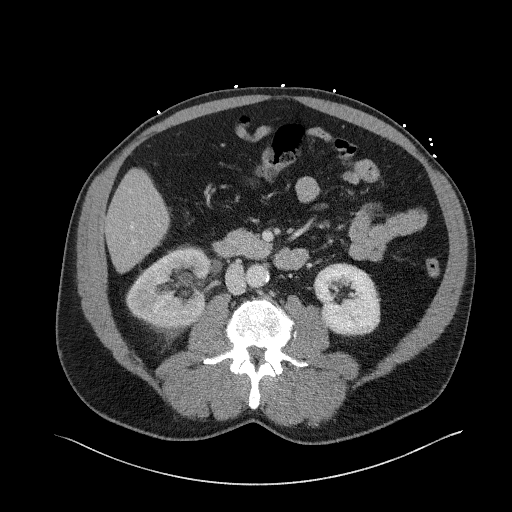
[im 62/109  lung]
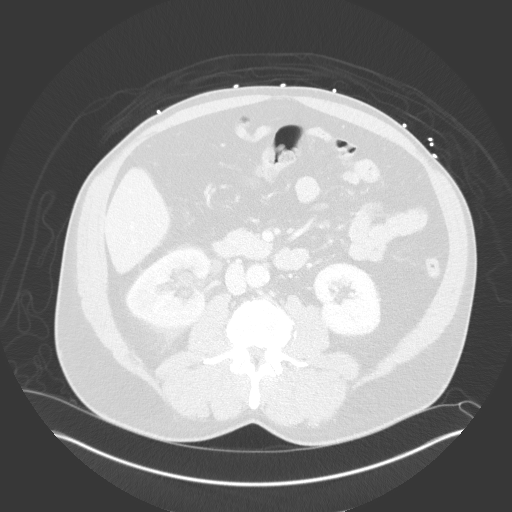
[im 78/109  soft-tissue]
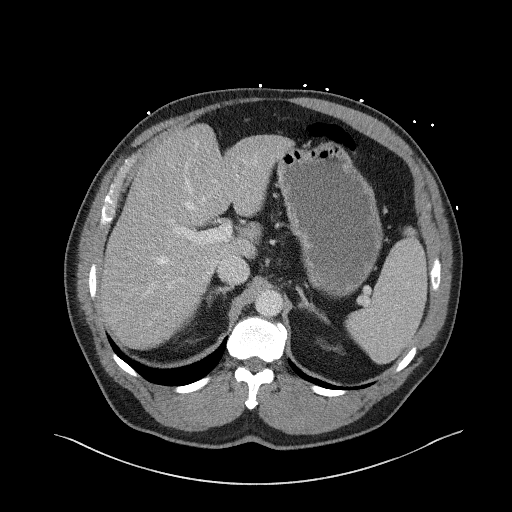
[im 78/109  lung]
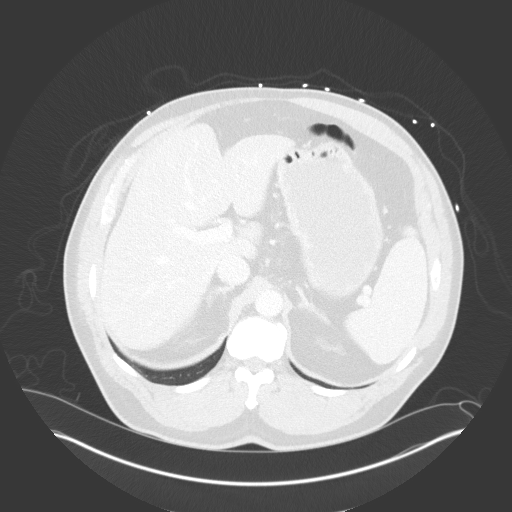
[im 93/109  soft-tissue]
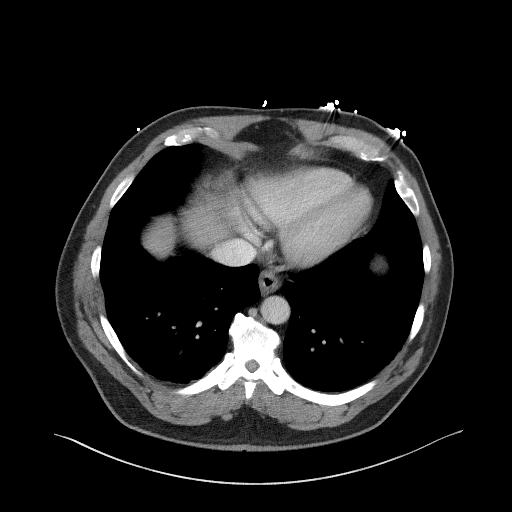
[im 93/109  lung]
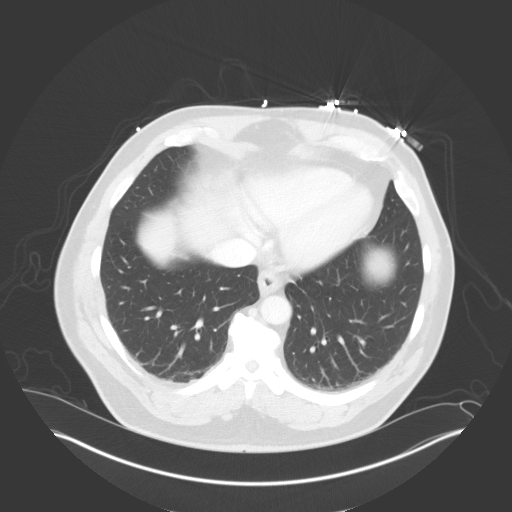

[Series 8: coronal mpr · coronal · 0.84mm/px · 2 of 162 slices shown, 3 images]
[im 54/162  soft-tissue]
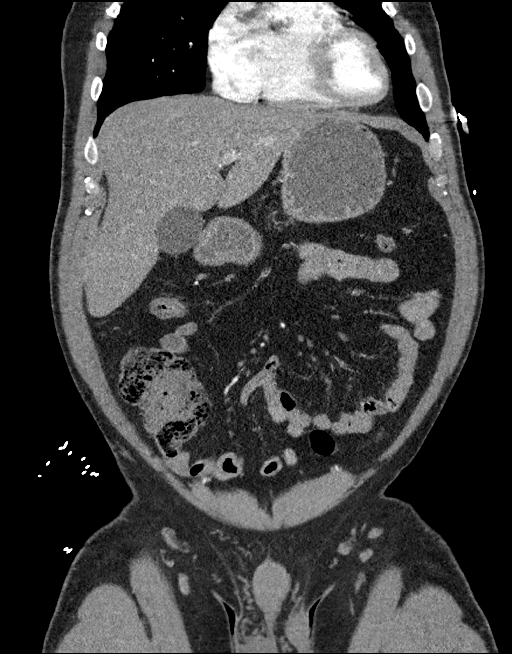
[im 54/162  bone]
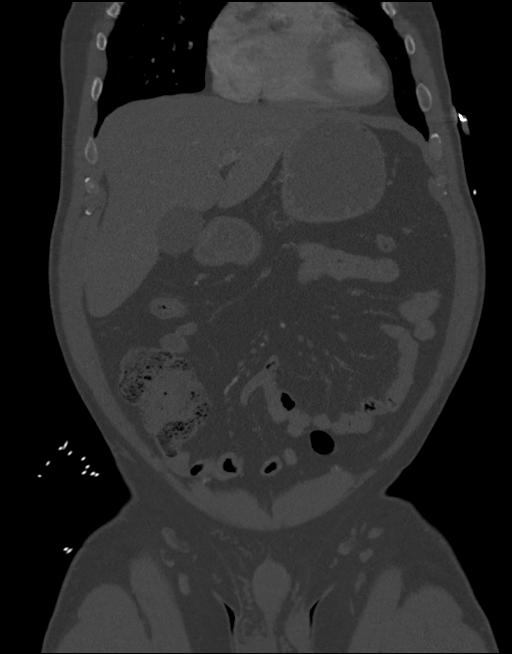
[im 108/162  soft-tissue]
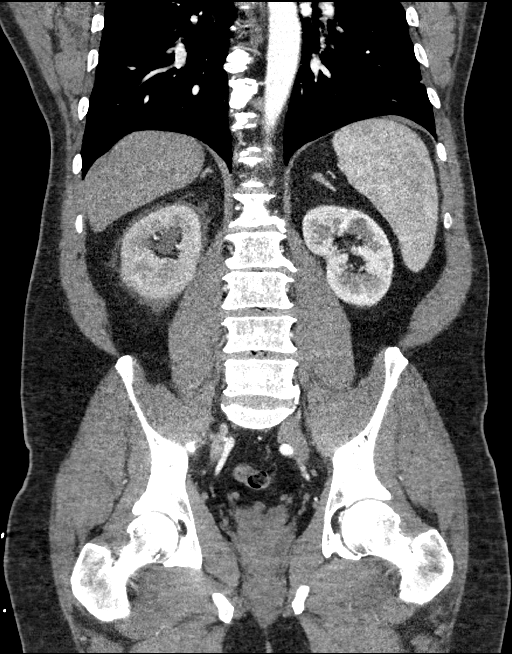

[11 of 46 positions shown; findings below may reference images not displayed]

FINDINGS: VASCULAR

Aorta: No aneurysm or dissection. Minimal atherosclerotic
calcifications.

Celiac: Patent without evidence of aneurysm, dissection, vasculitis
or significant stenosis.

SMA: Patent without evidence of aneurysm, dissection, vasculitis or
significant stenosis.

Renals: Both renal arteries are patent without evidence of aneurysm,
dissection, vasculitis, fibromuscular dysplasia or significant
stenosis.

IMA: Patent without evidence of aneurysm, dissection, vasculitis or
significant stenosis.

Inflow: Patent without evidence of aneurysm, dissection, vasculitis
or significant stenosis.

Proximal Outflow: Bilateral common femoral and visualized portions
of the superficial and profunda femoral arteries are patent without
evidence of aneurysm, dissection, vasculitis or significant
stenosis.

Veins: No obvious venous abnormality within the limitations of this
arterial phase study.

Review of the MIP images confirms the above findings.

NON-VASCULAR

Lower chest: Lung bases demonstrate mild dependent atelectasis.
Heart size within normal limits. Mild distal esophageal thickening.

Hepatobiliary: No focal liver abnormality is seen. No gallstones,
gallbladder wall thickening, or biliary dilatation.

Pancreas: Unremarkable. No pancreatic ductal dilatation or
surrounding inflammatory changes.

Spleen: Normal in size without focal abnormality.

Adrenals/Urinary Tract: Adrenal glands are within normal limits.
Moderate right perinephric fat stranding. Mild to moderate right
hydronephrosis and proximal right hydroureter, secondary to a 5 mm
stone within the proximal right ureter, several cm past the right
UPJ. There is no left hydronephrosis. Subcentimeter hypodense lesion
lower pole right kidney too small to further characterize. 1.1 cm
hypodense lesion mid to upper left kidney with intermediate density.
The urinary bladder is normal.

Stomach/Bowel: Stomach is within normal limits. Appendix appears
normal. No evidence of bowel wall thickening, distention, or
inflammatory changes.

Lymphatic: No grossly enlarged abdominal or pelvic lymph nodes

Reproductive: Prostate is slightly enlarged. Small amount of fat in
the left inguinal canal.

Other: No free air or free fluid.

Musculoskeletal: No acute or suspicious bone lesion
IMPRESSION: VASCULAR

Negative for aortic aneurysm or dissection. No significant vascular
stenosis in the abdomen or pelvis.

NON-VASCULAR

Mild to moderate right hydronephrosis and proximal hydroureter,
secondary to a 5 mm stone within the proximal right ureter, several
cm past the right UPJ. 1.1 cm hypodense lesion in the mid to upper
left kidney with intermediate density values, could be evaluated
with nonemergent MRI.

## 2018-12-21 ENCOUNTER — Other Ambulatory Visit: Payer: Self-pay

## 2018-12-21 MED ORDER — GABAPENTIN 300 MG PO CAPS: 300.0000 mg | ORAL_CAPSULE | Freq: Three times a day (TID) | ORAL | 3 refills | Status: DC

## 2019-03-14 ENCOUNTER — Other Ambulatory Visit: Payer: Self-pay | Admitting: Internal Medicine

## 2019-03-14 ENCOUNTER — Other Ambulatory Visit: Payer: Self-pay

## 2019-03-14 DIAGNOSIS — I10 Essential (primary) hypertension: Secondary | ICD-10-CM

## 2019-03-15 ENCOUNTER — Other Ambulatory Visit: Payer: Self-pay | Admitting: Physician Assistant

## 2019-03-15 MED ORDER — LISINOPRIL-HYDROCHLOROTHIAZIDE 20-25 MG PO TABS: 1.0000 | ORAL_TABLET | Freq: Every day | ORAL | 0 refills | Status: DC

## 2019-03-19 NOTE — Telephone Encounter (Signed)
Labs scheduled for 03/26/2019. Physical scheduled for 04/02/2019.  AMD

## 2019-03-26 ENCOUNTER — Other Ambulatory Visit: Payer: 59

## 2019-04-02 ENCOUNTER — Ambulatory Visit: Payer: 59

## 2019-04-02 ENCOUNTER — Other Ambulatory Visit: Payer: Self-pay

## 2019-04-02 DIAGNOSIS — Z0000 Encounter for general adult medical examination without abnormal findings: Secondary | ICD-10-CM

## 2019-04-03 LAB — CMP12+LP+TP+TSH+6AC+CBC/D/PLT
ALT: 38 IU/L (ref 0–44)
AST: 25 IU/L (ref 0–40)
Albumin/Globulin Ratio: 2 (ref 1.2–2.2)
Albumin: 4.5 g/dL (ref 3.8–4.9)
Alkaline Phosphatase: 68 IU/L (ref 39–117)
BUN/Creatinine Ratio: 14 (ref 10–24)
BUN: 15 mg/dL (ref 8–27)
Basophils Absolute: 0 10*3/uL (ref 0.0–0.2)
Basos: 0 %
Bilirubin Total: 0.7 mg/dL (ref 0.0–1.2)
Calcium: 9.5 mg/dL (ref 8.6–10.2)
Chloride: 102 mmol/L (ref 96–106)
Chol/HDL Ratio: 4.5 ratio (ref 0.0–5.0)
Cholesterol, Total: 143 mg/dL (ref 100–199)
Creatinine, Ser: 1.04 mg/dL (ref 0.76–1.27)
EOS (ABSOLUTE): 0.1 10*3/uL (ref 0.0–0.4)
Eos: 3 %
Estimated CHD Risk: 0.9 times avg. (ref 0.0–1.0)
Free Thyroxine Index: 1.9 (ref 1.2–4.9)
GFR calc Af Amer: 90 mL/min/{1.73_m2} (ref >59)
GFR calc non Af Amer: 78 mL/min/{1.73_m2} (ref >59)
GGT: 33 IU/L (ref 0–65)
Globulin, Total: 2.2 g/dL (ref 1.5–4.5)
Glucose: 112 mg/dL — ABNORMAL HIGH (ref 65–99)
HDL: 32 mg/dL — ABNORMAL LOW (ref >39)
Hematocrit: 50 % (ref 37.5–51.0)
Hemoglobin: 17.4 g/dL (ref 13.0–17.7)
Immature Grans (Abs): 0 10*3/uL (ref 0.0–0.1)
Immature Granulocytes: 0 %
Iron: 98 ug/dL (ref 38–169)
LDH: 179 IU/L (ref 121–224)
LDL Chol Calc (NIH): 72 mg/dL (ref 0–99)
Lymphocytes Absolute: 1.3 10*3/uL (ref 0.7–3.1)
Lymphs: 29 %
MCH: 31.8 pg (ref 26.6–33.0)
MCHC: 34.8 g/dL (ref 31.5–35.7)
MCV: 91 fL (ref 79–97)
Monocytes Absolute: 0.4 10*3/uL (ref 0.1–0.9)
Monocytes: 9 %
Neutrophils Absolute: 2.6 10*3/uL (ref 1.4–7.0)
Neutrophils: 59 %
Phosphorus: 2.7 mg/dL — ABNORMAL LOW (ref 2.8–4.1)
Platelets: 128 10*3/uL — ABNORMAL LOW (ref 150–450)
Potassium: 3.9 mmol/L (ref 3.5–5.2)
RBC: 5.47 x10E6/uL (ref 4.14–5.80)
RDW: 12.3 % (ref 11.6–15.4)
Sodium: 139 mmol/L (ref 134–144)
T3 Uptake Ratio: 31 % (ref 24–39)
T4, Total: 6.1 ug/dL (ref 4.5–12.0)
TSH: 2.6 u[IU]/mL (ref 0.450–4.500)
Total Protein: 6.7 g/dL (ref 6.0–8.5)
Triglycerides: 238 mg/dL — ABNORMAL HIGH (ref 0–149)
Uric Acid: 7.3 mg/dL (ref 3.7–8.6)
VLDL Cholesterol Cal: 39 mg/dL (ref 5–40)
WBC: 4.5 10*3/uL (ref 3.4–10.8)

## 2019-04-09 ENCOUNTER — Ambulatory Visit: Payer: 59 | Admitting: Physician Assistant

## 2019-04-09 ENCOUNTER — Encounter: Payer: Self-pay | Admitting: Physician Assistant

## 2019-04-09 ENCOUNTER — Other Ambulatory Visit: Payer: Self-pay

## 2019-04-09 VITALS — BP 138/78 | HR 60 | Temp 98.0°F | Ht 74.0 in | Wt 246.0 lb

## 2019-04-09 DIAGNOSIS — Z0000 Encounter for general adult medical examination without abnormal findings: Secondary | ICD-10-CM

## 2019-04-09 LAB — POCT URINALYSIS DIPSTICK
Bilirubin, UA: NEGATIVE
Blood, UA: NEGATIVE
Glucose, UA: NEGATIVE
Ketones, UA: NEGATIVE
Leukocytes, UA: NEGATIVE
Nitrite, UA: NEGATIVE
Protein, UA: NEGATIVE
Spec Grav, UA: 1.01 (ref 1.010–1.025)
Urobilinogen, UA: 0.2 E.U./dL
pH, UA: 6.5 (ref 5.0–8.0)

## 2019-04-09 NOTE — Progress Notes (Signed)
   Subjective: Annual Physical    Patient ID: Jose Moody, male    DOB: December 30, 1955, 63 y.o.   MRN: [id number]  HPI Patient for annual physical. Voices concern for multple joint pain.   Review of Systems Hyperlipidemia and Hypertension     Objective:   Physical Exam HEENT unremarkable Neck: Supple without adenopathy or bruits. Lungs: CTA Heart: RRR Abdomen: Normoactive BS and SNTTP Cervical/Lumbar Spine: No obvious deformity. F/E ROM Upper/Lower Extremities: No deformity. F/E ROM CNII-XII grossly      Assessment & Plan:  Well exam. Continue previous medication. Follow as needed.

## 2019-04-12 ENCOUNTER — Other Ambulatory Visit: Payer: Self-pay | Admitting: Physician Assistant

## 2019-04-12 ENCOUNTER — Other Ambulatory Visit: Payer: Self-pay

## 2019-04-12 DIAGNOSIS — M546 Pain in thoracic spine: Secondary | ICD-10-CM

## 2019-04-12 DIAGNOSIS — G8929 Other chronic pain: Secondary | ICD-10-CM

## 2019-04-12 MED ORDER — MELOXICAM 15 MG PO TABS: 15.0000 mg | ORAL_TABLET | Freq: Every day | ORAL | 0 refills | Status: DC | PRN

## 2019-07-23 ENCOUNTER — Other Ambulatory Visit: Payer: Self-pay

## 2019-07-23 DIAGNOSIS — G8929 Other chronic pain: Secondary | ICD-10-CM

## 2019-07-23 DIAGNOSIS — M47894 Other spondylosis, thoracic region: Secondary | ICD-10-CM

## 2019-07-23 DIAGNOSIS — M546 Pain in thoracic spine: Secondary | ICD-10-CM

## 2019-07-23 MED ORDER — MELOXICAM 15 MG PO TABS: 15.0000 mg | ORAL_TABLET | Freq: Every day | ORAL | 2 refills | Status: DC | PRN

## 2019-07-23 NOTE — Telephone Encounter (Signed)
Renal and liver function stable.  Patient taking mobic for chronic back pain.  Had annual physical with PA [facility] Nov 2020 BP 138/78 needs new Rx.  Electronic Rx sent to his pharmacy of choice.  Ensure he is counseled not to take other NSAIDS e.g. naproxen/naproxyn/diclofenac/voltaren/ibuprofen/motrin/advil/aleve when taking mobic/meloxicam.  He can take tylenol OTC for breakthrough pain.  Electronic Rx sent to his pharmacy of choice mobic 15mg  po daily #90 RF2.  Labs next due Nov 2021.

## 2019-08-12 ENCOUNTER — Other Ambulatory Visit: Payer: Self-pay

## 2019-08-12 MED ORDER — GABAPENTIN 300 MG PO CAPS: 300.0000 mg | ORAL_CAPSULE | Freq: Three times a day (TID) | ORAL | 3 refills | Status: DC

## 2019-08-12 MED ORDER — OMEPRAZOLE 20 MG PO CPDR: 20.0000 mg | DELAYED_RELEASE_CAPSULE | Freq: Every day | ORAL | 1 refills | Status: DC

## 2019-08-12 NOTE — Telephone Encounter (Signed)
Last OV 04/09/19

## 2019-08-13 ENCOUNTER — Telehealth: Payer: Self-pay

## 2019-08-13 NOTE — Telephone Encounter (Signed)
Jose Moody called stating Pharmacy told him [employer] wouldn't cover his omeprazole.  Initiated prior authorization for Omeprazole 20 mg capsules, Qty 60, Take 1- 2 caps po daily prn through covermymeds. Dx - GERD with Esophagitis (K21.0)  Prior Authorization approved by [employer].  Tried to call [facility] [phone]), went to voice mail - left message that [employer] approved Omeprazole for patient.  AMD

## 2019-09-16 ENCOUNTER — Other Ambulatory Visit: Payer: Self-pay

## 2019-09-16 DIAGNOSIS — I10 Essential (primary) hypertension: Secondary | ICD-10-CM

## 2019-09-17 MED ORDER — LISINOPRIL-HYDROCHLOROTHIAZIDE 20-25 MG PO TABS: 1.0000 | ORAL_TABLET | Freq: Every day | ORAL | 1 refills | Status: DC

## 2019-09-25 ENCOUNTER — Other Ambulatory Visit: Payer: Self-pay | Admitting: Physician Assistant

## 2019-10-07 ENCOUNTER — Ambulatory Visit: Payer: 59 | Admitting: Physician Assistant

## 2019-10-07 ENCOUNTER — Encounter: Payer: Self-pay | Admitting: Physician Assistant

## 2019-10-07 ENCOUNTER — Other Ambulatory Visit: Payer: Self-pay

## 2019-10-07 VITALS — BP 143/83 | HR 74 | Temp 98.1°F | Resp 16 | Ht 74.0 in | Wt 251.0 lb

## 2019-10-07 DIAGNOSIS — B351 Tinea unguium: Secondary | ICD-10-CM

## 2019-10-07 DIAGNOSIS — M10061 Idiopathic gout, right knee: Secondary | ICD-10-CM

## 2019-10-07 MED ORDER — CICLOPIROX 8 % EX SOLN: Freq: Every day | CUTANEOUS | 4 refills | Status: DC

## 2019-10-07 MED ORDER — ALLOPURINOL 100 MG PO TABS: 100.0000 mg | ORAL_TABLET | Freq: Every day | ORAL | 0 refills | Status: DC

## 2019-10-07 MED ORDER — INDOMETHACIN 50 MG PO CAPS: 50.0000 mg | ORAL_CAPSULE | Freq: Three times a day (TID) | ORAL | 0 refills | Status: DC

## 2019-10-07 NOTE — Progress Notes (Signed)
Going to [facility] 02/03/20 - 02/10/20 & it will be lobster season - Hx of gout in right knee.  Requesting Rx medication for gout - R Summers, PA-C prescribed Prednisone taper & ultram in 01/2018 for Right Knee Pain/Gout.  AMD

## 2019-10-07 NOTE — Progress Notes (Signed)
   Subjective:Onychomycosis    Patient ID: Jose Moody, male    DOB: 1955-07-04, 64 y.o.   MRN: [id number]  HPI Patient requested topical medication for toenail fungal infection.  Patient also requesting medication for gout.  Patient is pending a trip to a tropical [place] in 5 months.  Patient states he is hoping topical application would improve the appearance of his great toes before the trip.  Patient states he will also participate in deep sea fishing and eating of shellfish.  Patient state physicians shellfish aggravates his gout condition.  Review of Systems     , Hyperlipidemia, and hypertension. Objective:   Physical Exam Patient appears no acute distress.  Examination of the bilateral great toe reveals mildly hypertrophic and yellowing of the nails.       Assessment & Plan:Onychomycosis and gout  Patient given a prescription for allopurinol, indomethacin, and Ciclopirox.  Follow-up as necessary.

## 2019-10-22 ENCOUNTER — Other Ambulatory Visit: Payer: Self-pay | Admitting: Physician Assistant

## 2019-10-25 ENCOUNTER — Other Ambulatory Visit: Payer: Self-pay

## 2019-10-25 DIAGNOSIS — M10061 Idiopathic gout, right knee: Secondary | ICD-10-CM

## 2019-10-25 DIAGNOSIS — E785 Hyperlipidemia, unspecified: Secondary | ICD-10-CM

## 2019-10-25 MED ORDER — ALLOPURINOL 100 MG PO TABS: 100.0000 mg | ORAL_TABLET | Freq: Every day | ORAL | 1 refills | Status: DC

## 2019-10-25 MED ORDER — INDOMETHACIN 50 MG PO CAPS: 50.0000 mg | ORAL_CAPSULE | Freq: Three times a day (TID) | ORAL | 1 refills | Status: DC

## 2019-10-25 MED ORDER — ATORVASTATIN CALCIUM 20 MG PO TABS: 20.0000 mg | ORAL_TABLET | Freq: Every day | ORAL | 2 refills | Status: DC

## 2019-11-25 ENCOUNTER — Other Ambulatory Visit: Payer: Self-pay | Admitting: Physician Assistant

## 2019-11-25 DIAGNOSIS — M10061 Idiopathic gout, right knee: Secondary | ICD-10-CM

## 2019-12-16 ENCOUNTER — Other Ambulatory Visit: Payer: Self-pay

## 2019-12-16 DIAGNOSIS — K219 Gastro-esophageal reflux disease without esophagitis: Secondary | ICD-10-CM

## 2019-12-16 MED ORDER — OMEPRAZOLE 20 MG PO CPDR: 20.0000 mg | DELAYED_RELEASE_CAPSULE | Freq: Every day | ORAL | 3 refills | Status: DC

## 2019-12-30 ENCOUNTER — Telehealth: Payer: Self-pay

## 2019-12-30 ENCOUNTER — Other Ambulatory Visit: Payer: Self-pay | Admitting: Physician Assistant

## 2019-12-30 DIAGNOSIS — M10061 Idiopathic gout, right knee: Secondary | ICD-10-CM

## 2019-12-30 NOTE — Telephone Encounter (Signed)
Jose Moody called to let us know he's no longer taking Meloxicam.  States his pharmacist told him not to take both Meloxicam & Indomethacin.  He's only taking the Indomethacin now for gout.  AMD

## 2020-01-06 ENCOUNTER — Telehealth: Payer: Self-pay

## 2020-01-09 NOTE — Telephone Encounter (Signed)
Rx refill

## 2020-01-30 DIAGNOSIS — Z20822 Contact with and (suspected) exposure to covid-19: Secondary | ICD-10-CM | POA: Diagnosis not present

## 2020-03-11 ENCOUNTER — Other Ambulatory Visit: Payer: Self-pay

## 2020-03-11 DIAGNOSIS — I10 Essential (primary) hypertension: Secondary | ICD-10-CM

## 2020-03-11 MED ORDER — LISINOPRIL-HYDROCHLOROTHIAZIDE 20-25 MG PO TABS: 1.0000 | ORAL_TABLET | Freq: Every day | ORAL | 2 refills | Status: DC

## 2020-03-16 NOTE — Progress Notes (Signed)
Scheduled to complete physical 03/24/20 with Ron Smith, PA-C.  AMD 

## 2020-03-17 ENCOUNTER — Other Ambulatory Visit: Payer: Self-pay

## 2020-03-17 ENCOUNTER — Telehealth: Payer: Self-pay

## 2020-03-17 ENCOUNTER — Ambulatory Visit: Payer: Self-pay

## 2020-03-17 DIAGNOSIS — Z0000 Encounter for general adult medical examination without abnormal findings: Secondary | ICD-10-CM

## 2020-03-17 LAB — POCT URINALYSIS DIPSTICK
Bilirubin, UA: NEGATIVE
Blood, UA: NEGATIVE
Glucose, UA: NEGATIVE
Ketones, UA: NEGATIVE
Leukocytes, UA: NEGATIVE
Nitrite, UA: NEGATIVE
Protein, UA: NEGATIVE
Spec Grav, UA: 1.02 (ref 1.010–1.025)
Urobilinogen, UA: 0.2 E.U./dL
pH, UA: 6 (ref 5.0–8.0)

## 2020-03-17 NOTE — Telephone Encounter (Signed)
Rx refill request in Epic - re-routed to Ron Smith, PA-C.  AMD 

## 2020-03-18 LAB — CMP12+LP+TP+TSH+6AC+PSA+CBC…
ALT: 43 IU/L (ref 0–44)
AST: 32 IU/L (ref 0–40)
Albumin/Globulin Ratio: 2.1 (ref 1.2–2.2)
Albumin: 4.8 g/dL (ref 3.8–4.8)
Alkaline Phosphatase: 63 IU/L (ref 44–121)
BUN/Creatinine Ratio: 12 (ref 10–24)
BUN: 12 mg/dL (ref 8–27)
Basophils Absolute: 0 10*3/uL (ref 0.0–0.2)
Basos: 0 %
Bilirubin Total: 0.6 mg/dL (ref 0.0–1.2)
Calcium: 9.3 mg/dL (ref 8.6–10.2)
Chloride: 101 mmol/L (ref 96–106)
Chol/HDL Ratio: 3.6 ratio (ref 0.0–5.0)
Cholesterol, Total: 124 mg/dL (ref 100–199)
Creatinine, Ser: 0.97 mg/dL (ref 0.76–1.27)
EOS (ABSOLUTE): 0.1 10*3/uL (ref 0.0–0.4)
Eos: 2 %
Estimated CHD Risk: 0.6 times avg. (ref 0.0–1.0)
Free Thyroxine Index: 1.7 (ref 1.2–4.9)
GFR calc Af Amer: 97 mL/min/{1.73_m2} (ref >59)
GFR calc non Af Amer: 84 mL/min/{1.73_m2} (ref >59)
GGT: 34 IU/L (ref 0–65)
Globulin, Total: 2.3 g/dL (ref 1.5–4.5)
Glucose: 111 mg/dL — ABNORMAL HIGH (ref 65–99)
HDL: 34 mg/dL — ABNORMAL LOW (ref >39)
Hematocrit: 51.5 % — ABNORMAL HIGH (ref 37.5–51.0)
Hemoglobin: 17.8 g/dL — ABNORMAL HIGH (ref 13.0–17.7)
Immature Grans (Abs): 0 10*3/uL (ref 0.0–0.1)
Immature Granulocytes: 0 %
Iron: 95 ug/dL (ref 38–169)
LDH: 193 IU/L (ref 121–224)
LDL Chol Calc (NIH): 50 mg/dL (ref 0–99)
Lymphocytes Absolute: 1.3 10*3/uL (ref 0.7–3.1)
Lymphs: 25 %
MCH: 33 pg (ref 26.6–33.0)
MCHC: 34.6 g/dL (ref 31.5–35.7)
MCV: 95 fL (ref 79–97)
Monocytes Absolute: 0.5 10*3/uL (ref 0.1–0.9)
Monocytes: 10 %
Neutrophils Absolute: 3.3 10*3/uL (ref 1.4–7.0)
Neutrophils: 63 %
Phosphorus: 2.6 mg/dL — ABNORMAL LOW (ref 2.8–4.1)
Platelets: 133 10*3/uL — ABNORMAL LOW (ref 150–450)
Potassium: 3.9 mmol/L (ref 3.5–5.2)
Prostate Specific Ag, Serum: 1.6 ng/mL (ref 0.0–4.0)
RBC: 5.4 x10E6/uL (ref 4.14–5.80)
RDW: 12.5 % (ref 11.6–15.4)
Sodium: 139 mmol/L (ref 134–144)
T3 Uptake Ratio: 29 % (ref 24–39)
T4, Total: 5.9 ug/dL (ref 4.5–12.0)
TSH: 2.3 u[IU]/mL (ref 0.450–4.500)
Total Protein: 7.1 g/dL (ref 6.0–8.5)
Triglycerides: 253 mg/dL — ABNORMAL HIGH (ref 0–149)
Uric Acid: 6.6 mg/dL (ref 3.8–8.4)
VLDL Cholesterol Cal: 40 mg/dL (ref 5–40)
WBC: 5.3 10*3/uL (ref 3.4–10.8)

## 2020-03-24 ENCOUNTER — Other Ambulatory Visit: Payer: Self-pay

## 2020-03-24 ENCOUNTER — Other Ambulatory Visit: Payer: Self-pay | Admitting: Emergency Medicine

## 2020-03-24 ENCOUNTER — Ambulatory Visit: Payer: Self-pay | Admitting: Physician Assistant

## 2020-03-24 ENCOUNTER — Encounter: Payer: Self-pay | Admitting: Physician Assistant

## 2020-03-24 VITALS — BP 150/90 | HR 66 | Temp 97.1°F | Resp 12 | Ht 74.0 in | Wt 249.0 lb

## 2020-03-24 DIAGNOSIS — Z0000 Encounter for general adult medical examination without abnormal findings: Secondary | ICD-10-CM

## 2020-03-24 DIAGNOSIS — M10061 Idiopathic gout, right knee: Secondary | ICD-10-CM

## 2020-03-24 MED ORDER — TERBINAFINE HCL 250 MG PO TABS: 250.0000 mg | ORAL_TABLET | Freq: Every day | ORAL | 1 refills | Status: DC

## 2020-03-24 MED ORDER — ATORVASTATIN CALCIUM 40 MG PO TABS: 40.0000 mg | ORAL_TABLET | Freq: Every day | ORAL | 3 refills | Status: DC

## 2020-03-24 NOTE — Progress Notes (Signed)
   Subjective: Annual physical exam    Patient ID: IHAN PAT, male    DOB: 01-26-1956, 64 y.o.   MRN: [id number]  HPI Patient presents for annual physical exam.  Patient was concerned secondary to toenail infection.  It was noted patient blood pressure elevated but did not take his medication this morning.   Review of Systems    Hyperlipidemia Objective:   Physical Exam No acute distress.  HEENT was unremarkable.  Neck is supple no adenopathy or bruits.  Lungs clear to auscultation.  Heart regular rate and rhythm.  Abdomen with negative HSM, soft.  Slightly distended.  Soft and nontender palpation.  No obvious deformity to the upper or lower extremities.  Patient is full equal range of motion of her lower extremities.  No other cervical or lumbar spine deformity.  Patient has full neck range of motion cervical lumbar spine.     Assessment & Plan: Well exam  Discussed lab results with patient.  Due to the increase triglycerides and low HDL patient Lipitor will be increased from 20 to 40 mg patient will start Lamisil and have his labs rechecked in 3 months.

## 2020-03-24 NOTE — Progress Notes (Signed)
Hasn't taken BP med this morning.  Requesting a Rx refill for Lamisil - not currently taking.  States toenails are some better.

## 2020-04-05 DIAGNOSIS — Z20822 Contact with and (suspected) exposure to covid-19: Secondary | ICD-10-CM | POA: Diagnosis not present

## 2020-04-20 DIAGNOSIS — Z20822 Contact with and (suspected) exposure to covid-19: Secondary | ICD-10-CM | POA: Diagnosis not present

## 2020-04-28 DIAGNOSIS — H5203 Hypermetropia, bilateral: Secondary | ICD-10-CM | POA: Diagnosis not present

## 2020-06-18 ENCOUNTER — Other Ambulatory Visit: Payer: Self-pay

## 2020-06-18 DIAGNOSIS — E782 Mixed hyperlipidemia: Secondary | ICD-10-CM

## 2020-06-18 NOTE — Progress Notes (Signed)
Scheduled to review lab results on 06/25/20 at 8:15 am.  3 month lipid follow-up.  AMD

## 2020-06-19 LAB — APO A1 + B + RATIO
Apolipo. B/A-1 Ratio: 0.7 ratio (ref 0.0–0.7)
Apolipoprotein A-1: 133 mg/dL (ref 101–178)
Apolipoprotein B: 91 mg/dL — ABNORMAL HIGH (ref <90)

## 2020-06-19 LAB — LIPID PANEL
Chol/HDL Ratio: 4.8 ratio (ref 0.0–5.0)
Cholesterol, Total: 174 mg/dL (ref 100–199)
HDL: 36 mg/dL — ABNORMAL LOW (ref >39)
LDL Chol Calc (NIH): 88 mg/dL (ref 0–99)
Triglycerides: 299 mg/dL — ABNORMAL HIGH (ref 0–149)
VLDL Cholesterol Cal: 50 mg/dL — ABNORMAL HIGH (ref 5–40)

## 2020-06-24 ENCOUNTER — Other Ambulatory Visit: Payer: Self-pay | Admitting: Physician Assistant

## 2020-06-24 DIAGNOSIS — G629 Polyneuropathy, unspecified: Secondary | ICD-10-CM

## 2020-06-25 ENCOUNTER — Other Ambulatory Visit: Payer: Self-pay | Admitting: Adult Medicine

## 2020-06-25 ENCOUNTER — Encounter: Payer: Self-pay | Admitting: Adult Medicine

## 2020-06-25 ENCOUNTER — Ambulatory Visit: Payer: Self-pay | Admitting: Adult Medicine

## 2020-06-25 ENCOUNTER — Other Ambulatory Visit: Payer: Self-pay

## 2020-06-25 VITALS — BP 152/89 | HR 80 | Temp 98.1°F | Resp 12 | Ht 74.0 in | Wt 255.0 lb

## 2020-06-25 DIAGNOSIS — M792 Neuralgia and neuritis, unspecified: Secondary | ICD-10-CM

## 2020-06-25 DIAGNOSIS — I119 Hypertensive heart disease without heart failure: Secondary | ICD-10-CM

## 2020-06-25 MED ORDER — PRAZOSIN HCL 1 MG PO CAPS: 1.0000 mg | ORAL_CAPSULE | Freq: Every day | ORAL | 0 refills | Status: DC

## 2020-06-25 NOTE — Progress Notes (Addendum)
HISTORY  Chief Complaint Review Results from testing 03/17/21  HPI  New patient to this provider Jose Moody is a 65 y.o. male   Hx of tingling burning feet x 4ys  rx Gabapentin effective. euglycemic,   Rx for hyperlipidemia  Meds incr 20-67m Liptor last exam persists with TG. He is concerned and Seek further input and direction on course      PMH covid19+  nov21  review of med reveals  Ace/Arb rx for htn covid19 vac + booster Past Medical History:  Diagnosis Date  . Arthritis   . GERD (gastroesophageal reflux disease)   . Heart disease   . Hyperlipidemia   . Hypertension   . Joint pain   . Kidney stone     Patient Active Problem List   Diagnosis Date Noted  . Umbilical hernia without obstruction and without gangrene 07/29/2014  . DJD (degenerative joint disease) of thoracic spine 07/26/2009    Past Surgical History:  Procedure Laterality Date  . COLONOSCOPY  2013-2014  . HERNIA REPAIR  37/09/62   umbilical hernia  . UPPER GI ENDOSCOPY  04/25/1996    Prior to Admission medications   Medication Sig Start Date End Date Taking? Authorizing Provider  allopurinol (ZYLOPRIM) 100 MG tablet TAKE ONE TABLET BY MOUTH EVERY DAY 03/24/20  Yes SSable Feil PA-C  atorvastatin (LIPITOR) 40 MG tablet Take 1 tablet (40 mg total) by mouth daily. 03/24/20  Yes SSable Feil PA-C  ciclopirox ([facility] 8 % solution Apply topically at bedtime. Apply over nail and surrounding skin. Apply daily over previous coat. After seven (7) days, [place] remove with alcohol and continue cycle. 10/07/19  Yes SSable Feil PA-C  gabapentin (NEURONTIN) 300 MG capsule Take 1 capsule (300 mg total) by mouth 3 (three) times daily. 08/12/19  Yes SSable Feil PA-C  indomethacin (INDOCIN) 50 MG capsule TAKE 1 CAPSULE BY MOUTH 3 TIMES DAILY WITH MEALS 03/18/20  Yes SSable Feil PA-C  lisinopril-hydrochlorothiazide (ZESTORETIC) 20-25 MG tablet Take 1 tablet by mouth daily. 03/11/20 06/09/20 Yes  SSable Feil PA-C  Multiple Vitamin (MULTIVITAMIN) tablet Take 1 tablet by mouth daily.   Yes [provider]  omeprazole (PRILOSEC) 20 MG capsule Take 1 capsule (20 mg total) by mouth daily. 12/16/19  Yes SSable Feil PA-C  terbinafine (LAMISIL) 250 MG tablet Take 1 tablet (250 mg total) by mouth daily. 03/24/20  Yes SSable Feil PA-C  meloxicam (MOBIC) 15 MG tablet Take 1 tablet (15 mg total) by mouth daily as needed for pain. Patient not taking: No sig reported 07/23/19   BGerarda FractionA, NP  terbinafine (LAMISIL) 250 MG tablet Take 250 mg by mouth daily.    [provider]    Allergies Patient has no known allergies.  Family History  Problem Relation Age of Onset  . Diabetes Mother   . Heart disease Father     Social History Social History   Tobacco Use  . Smoking status: Never Smoker  . Smokeless tobacco: Never Used  Substance Use Topics  . Alcohol use: Yes  . Drug use: No    Review of Systems Constitutional: No fever/chills Eyes: No visual changes. ENT: No sore throat. Cardiovascular: Denies chest pain. Respiratory: Denies shortness of breath. Gastrointestinal: No abdominal pain.  No nausea, no vomiting.  No diarrhea.  No constipation. Genitourinary: Negative for dysuria. Musculoskeletal: Negative for back pain. Skin: Negative for rash. Neurological: Negative for headaches, focal weakness or numbness.  _______________________________________  PHYSICAL EXAM:  VITAL SIGNS: bmi >30  bp152/89 Focused exam Constitutional: Alert and oriented. Well appearing and in no acute distress. Eyes: Conjunctivae B/l early occular pterygium . PERRL. EOMI. Head: Atraumatic.  Nose: No congestion/rhinnorhea. Mouth/Throat: Mucous membranes are moist.  Oropharynx non-erythematous. Neck: No stridor.  Cardiovascular: Normal rate, regular rhythm. Grossly normal heart sounds.   Respiratory: Normal respiratory effort.  Lungs CTAB. Gastrointestinal: Soft  and nontender. No distention. No abdominal bruits.  Genitourinary: deferred Musculoskeletal: No lower extremity tenderness nor edema.  No joint effusions. Neurologic:  Normal speech and language. No gross focal neurologic deficits except numbness feet LE are appreciated.No gait instability appreciated. Skin:  Skin is warm, dry and intact. No rash noted. Good foot hygiene Psychiatric: Mood and affect are normal. Speech and behavior are normal. _____________________________________   LABS Component Ref Range & Units 7 d ago 3 mo ago 1 yr ago  Cholesterol, Total 100 - 199 mg/dL 174  124  143   Triglycerides 0 - 149 mg/dL 299High  253High  238High   HDL >39 mg/dL 36Low  34Low  32Low   VLDL Cholesterol Cal 5 - 40 mg/dL 50High  40  39   LDL Chol Calc (NIH) 0 - 99 mg/dL 88  50  72   Chol/HDL Ratio 0.0 - 5.0 ratio 4.8  3.6 CM  4.5 CM    Apolipoprotein A-1 101 - 178 mg/dL 133   Apolipoprotein B <90 mg/dL 91High    Component Ref Range & Units 1 yr ago  (04/02/19)  3 yr ago  (08/29/16)     Platelets 150 - 450 x10E3/uL 128Low    136Low    Result as of 07/05/2020   Ref. Range 03/17/2020 08:11  Sodium Latest Ref Range: 134 - 144 mmol/L 139  Potassium Latest Ref Range: 3.5 - 5.2 mmol/L 3.9  Chloride Latest Ref Range: 96 - 106 mmol/L 101  Glucose Latest Ref Range: 65 - 99 mg/dL 111 (H)  BUN Latest Ref Range: 8 - 27 mg/dL 12  Creatinine Latest Ref Range: 0.76 - 1.27 mg/dL 0.97  Calcium Latest Ref Range: 8.6 - 10.2 mg/dL 9.3  BUN/Creatinine Ratio Latest Ref Range: 10 - 24  12  Phosphorus Latest Ref Range: 2.8 - 4.1 mg/dL 2.6 (L)  Alkaline Phosphatase Latest Ref Range: 44 - 121 IU/L 63  Albumin Latest Ref Range: 3.8 - 4.8 g/dL 4.8  Albumin/Globulin Ratio Latest Ref Range: 1.2 - 2.2  2.1  Uric Acid Latest Ref Range: 3.8 - 8.4 mg/dL 6.6  AST Latest Ref Range: 0 - 40 IU/L 32  ALT Latest Ref Range: 0 - 44 IU/L 43  Total Protein Latest Ref Range: 6.0 - 8.5 g/dL 7.1  Total Bilirubin  Latest Ref Range: 0.0 - 1.2 mg/dL 0.6  GGT Latest Ref Range: 0 - 65 IU/L 34  GFR, Est Non African American Latest Ref Range: >59 mL/min/1.73 84  GFR, Est African American Latest Ref Range: >59 mL/min/1.73 97  Estimated CHD Risk Latest Ref Range: 0.0 - 1.0 times avg. 0.6  LDH Latest Ref Range: 121 - 224 IU/L 193  Total CHOL/HDL Ratio Latest Ref Range: 0.0 - 5.0 ratio 3.6  Cholesterol, Total Latest Ref Range: 100 - 199 mg/dL 124  HDL Cholesterol Latest Ref Range: >39 mg/dL 34 (L)  Triglycerides Latest Ref Range: 0 - 149 mg/dL 253 (H)  VLDL Cholesterol Cal Latest Ref Range: 5 - 40 mg/dL 40  LDL Chol Calc (NIH) Latest Ref Range: 0 - 99 mg/dL  50  Iron Latelt Ref Range: 38 - 169 ug/dL 95  Globulin, Total Latest Ref Range: 1.5 - 4.5 g/dL 2.3   06/25/18 homocysteine, esr nl  Ref. Range 06/18/2020 08:18  Apolipoprotein B Latest Ref Range: <90 mg/dL 91 (H)    EKG 02/2020 reviewed NSR nl interval, nl morphology   INITIAL IMPRESSION / ASSESSMENT  Htn  Uncontrolled Adjust med  60 yr  Add and transition to minipress 28m bid  Will titrated as required   Hypertensive heart disease   Update lab homocysteine, reduce bmi, truncal obesity  Hyperlipidemia  Labs suggest intrinsic production controlled on Medications  extrinsic source uncontrolled     Elevate TG low hdl-  discussed adding exercise with  phosphotydlcholine reduce sat fat intake  Repeat lipid panel 3-4 months    Gouty arthritis  (past assumption calcium urate) Instructed to Hold qd indocin, use only with any flareup reduce meat intake make transition to chicken fish eg Add diet hi in PUFA hi omega3 fish salmon, sardine, herring alternate white fish    Review of Low phos lab suggests relative increase pth or increase calcium excretion will Add Vitd to regimen    Peripheral neuralgia etiology? Cvd, dm neuropathy  Current use Neurontin Update next lab thiamine level   Constipation, mild  Bmi>30 abd girth 46.5  Discussed med regimen gn,  increase fiber ie metamucil   Reduce fried food

## 2020-06-25 NOTE — Progress Notes (Signed)
Review 3 month follow-up labs requested by Durward Parcel, PA-C.  Physical 03/24/20 with Durward Parcel, PA-C.  States hasn't taken BP med this morning. States still just taking 1/2 pill for BP.  Dose cut in half during the summer because of heat & dehydration.  Manual BP Recheck = 150/90  AMD

## 2020-06-26 LAB — HOMOCYSTEINE: Homocysteine: 9.1 umol/L (ref 0.0–17.2)

## 2020-06-26 LAB — SEDIMENTATION RATE: Sed Rate: 3 mm/hr (ref 0–30)

## 2020-06-26 LAB — VITAMIN B1

## 2020-06-29 ENCOUNTER — Telehealth: Payer: Self-pay

## 2020-06-29 ENCOUNTER — Encounter: Payer: Self-pay | Admitting: Adult Medicine

## 2020-06-29 NOTE — Telephone Encounter (Signed)
Rx refill request already in Epic - rerouted to Dr. William Holder.  AMD 

## 2020-06-30 ENCOUNTER — Ambulatory Visit (LOCAL_COMMUNITY_HEALTH_CENTER): Payer: 59

## 2020-06-30 ENCOUNTER — Other Ambulatory Visit: Payer: Self-pay | Admitting: Physician Assistant

## 2020-06-30 ENCOUNTER — Other Ambulatory Visit: Payer: Self-pay

## 2020-06-30 DIAGNOSIS — K219 Gastro-esophageal reflux disease without esophagitis: Secondary | ICD-10-CM

## 2020-06-30 DIAGNOSIS — Z719 Counseling, unspecified: Secondary | ICD-10-CM

## 2020-06-30 DIAGNOSIS — Z539 Procedure and treatment not carried out, unspecified reason: Secondary | ICD-10-CM

## 2020-06-30 NOTE — Patient Instructions (Signed)
Patient requesting his 2nd dose of Hep A.  Per the [facility], patient not due until 07/05/2020.  Patient will reschedule for Monday 07/06/2020. [facility] copy given to patient. Hart Carwin, RN

## 2020-07-05 MED ORDER — VITAMIN D (ERGOCALCIFEROL) 1.25 MG (50000 UNIT) PO CAPS: 50000.0000 [IU] | ORAL_CAPSULE | ORAL | 1 refills | Status: AC

## 2020-07-06 ENCOUNTER — Ambulatory Visit (LOCAL_COMMUNITY_HEALTH_CENTER): Payer: 59

## 2020-07-06 ENCOUNTER — Other Ambulatory Visit: Payer: Self-pay

## 2020-07-06 DIAGNOSIS — Z23 Encounter for immunization: Secondary | ICD-10-CM | POA: Diagnosis not present

## 2020-07-06 NOTE — Progress Notes (Signed)
Tolerated Hep A #2 well today. Updated [facility] copy given. Jerel Shepherd, RN

## 2020-07-13 LAB — VITAMIN B1: Thiamine: 156.6 nmol/L (ref 66.5–200.0)

## 2020-08-06 ENCOUNTER — Other Ambulatory Visit: Payer: Self-pay | Admitting: Physician Assistant

## 2020-08-06 ENCOUNTER — Ambulatory Visit: Payer: Self-pay | Admitting: Physician Assistant

## 2020-08-06 ENCOUNTER — Other Ambulatory Visit: Payer: Self-pay

## 2020-08-06 ENCOUNTER — Encounter: Payer: Self-pay | Admitting: Physician Assistant

## 2020-08-06 VITALS — BP 130/80 | HR 68 | Temp 98.0°F | Ht 74.0 in | Wt 233.0 lb

## 2020-08-06 DIAGNOSIS — E782 Mixed hyperlipidemia: Secondary | ICD-10-CM

## 2020-08-06 MED ORDER — DICLOFENAC SODIUM 1 % EX GEL: 2.0000 g | Freq: Four times a day (QID) | CUTANEOUS | 1 refills | Status: DC

## 2020-08-06 NOTE — Progress Notes (Signed)
Eating mainly fish & chicken. Cut out beer.   Taking the Metamucil  Left knee has been bothering him since the increase in Lipitor - thought it was a gout flare-up  States he feels so much better.  AMD

## 2020-08-06 NOTE — Progress Notes (Signed)
   Subjective: Hyperlipidemia    Patient ID: Jose Moody, male    DOB: 07/10/1955, 65 y.o.   MRN: [id number]  HPI Patient presents for 68-month follow-up for hyperlipidemia.  Patient reports  22 pound weight loss secondary to diet and exercise.  Patient state he is feeling great and is motivated to continue lifestyle modifications.  Patient state mild discomfort in left knee. Review of Systems GERD, gout, hyperlipidemia, and hypertension.    Objective:   Physical Exam No acute distress.  BP is 130/80, pulse 68, temperature 98, O2 sat is 98% on room air.  Patient weight is 233.  HEENT is unremarkable.  Neck is supple for adenopathy or bruits.  Lungs clear to auscultation.  Heart regular rate and rhythm.  Abdomen with negative HSM, normoactive bowel sounds, soft and nontender to palpation.  No obvious deformity to the left knee.  Mild crepitus with palpation.  No edema or erythema.       Assessment & Plan: Hyperlipidemia.  Congratulated patient on weight loss and due to lifestyle modifications.  We will repeat labs today and will discuss telephonically the results.  Patient advised to continue previous medications pending lab results.  If labs confirm decrease hyperlipidemia will follow-up in 6 months.  Patient given a prescription for Voltaren to apply to left knee as directed.

## 2020-08-07 LAB — CMP12+LP+TP+TSH+6AC+CBC/D/PLT
ALT: 28 IU/L (ref 0–44)
AST: 22 IU/L (ref 0–40)
Albumin/Globulin Ratio: 2.1 (ref 1.2–2.2)
Albumin: 4.8 g/dL (ref 3.8–4.8)
Alkaline Phosphatase: 76 IU/L (ref 44–121)
BUN/Creatinine Ratio: 12 (ref 10–24)
BUN: 12 mg/dL (ref 8–27)
Basophils Absolute: 0 10*3/uL (ref 0.0–0.2)
Basos: 0 %
Bilirubin Total: 0.7 mg/dL (ref 0.0–1.2)
Calcium: 9.9 mg/dL (ref 8.6–10.2)
Chloride: 102 mmol/L (ref 96–106)
Chol/HDL Ratio: 3.4 ratio (ref 0.0–5.0)
Cholesterol, Total: 111 mg/dL (ref 100–199)
Creatinine, Ser: 1.02 mg/dL (ref 0.76–1.27)
EOS (ABSOLUTE): 0.1 10*3/uL (ref 0.0–0.4)
Eos: 1 %
Estimated CHD Risk: 0.5 times avg. (ref 0.0–1.0)
Free Thyroxine Index: 1.9 (ref 1.2–4.9)
GGT: 25 IU/L (ref 0–65)
Globulin, Total: 2.3 g/dL (ref 1.5–4.5)
Glucose: 102 mg/dL — ABNORMAL HIGH (ref 65–99)
HDL: 33 mg/dL — ABNORMAL LOW (ref >39)
Hematocrit: 53.4 % — ABNORMAL HIGH (ref 37.5–51.0)
Hemoglobin: 17.8 g/dL — ABNORMAL HIGH (ref 13.0–17.7)
Immature Grans (Abs): 0 10*3/uL (ref 0.0–0.1)
Immature Granulocytes: 0 %
Iron: 136 ug/dL (ref 38–169)
LDH: 182 IU/L (ref 121–224)
LDL Chol Calc (NIH): 48 mg/dL (ref 0–99)
Lymphocytes Absolute: 0.9 10*3/uL (ref 0.7–3.1)
Lymphs: 21 %
MCH: 31.2 pg (ref 26.6–33.0)
MCHC: 33.3 g/dL (ref 31.5–35.7)
MCV: 94 fL (ref 79–97)
Monocytes Absolute: 0.3 10*3/uL (ref 0.1–0.9)
Monocytes: 7 %
Neutrophils Absolute: 3 10*3/uL (ref 1.4–7.0)
Neutrophils: 71 %
Phosphorus: 2.2 mg/dL — ABNORMAL LOW (ref 2.8–4.1)
Platelets: 131 10*3/uL — ABNORMAL LOW (ref 150–450)
Potassium: 4.2 mmol/L (ref 3.5–5.2)
RBC: 5.7 x10E6/uL (ref 4.14–5.80)
RDW: 13.1 % (ref 11.6–15.4)
Sodium: 139 mmol/L (ref 134–144)
T3 Uptake Ratio: 29 % (ref 24–39)
T4, Total: 6.7 ug/dL (ref 4.5–12.0)
TSH: 1.91 u[IU]/mL (ref 0.450–4.500)
Total Protein: 7.1 g/dL (ref 6.0–8.5)
Triglycerides: 182 mg/dL — ABNORMAL HIGH (ref 0–149)
Uric Acid: 8.2 mg/dL (ref 3.8–8.4)
VLDL Cholesterol Cal: 30 mg/dL (ref 5–40)
WBC: 4.3 10*3/uL (ref 3.4–10.8)
eGFR: 83 mL/min/{1.73_m2} (ref >59)

## 2020-08-21 ENCOUNTER — Other Ambulatory Visit: Payer: Self-pay

## 2020-08-21 NOTE — Telephone Encounter (Signed)
Rx refill request already in Epic - re-routed to Ron Smith, PA-C.  AMD 

## 2020-09-04 ENCOUNTER — Other Ambulatory Visit: Payer: Self-pay

## 2020-09-04 ENCOUNTER — Ambulatory Visit: Payer: Self-pay | Admitting: Adult Health

## 2020-09-04 ENCOUNTER — Encounter: Payer: Self-pay | Admitting: Adult Health

## 2020-09-04 ENCOUNTER — Ambulatory Visit
Admission: RE | Admit: 2020-09-04 | Discharge: 2020-09-04 | Disposition: A | Discharge status: Home / Self Care | Payer: 59 | Source: Ambulatory Visit | Attending: Adult Health | Admitting: Adult Health

## 2020-09-04 ENCOUNTER — Ambulatory Visit
Admission: RE | Admit: 2020-09-04 | Discharge: 2020-09-04 | Disposition: A | Discharge status: Home / Self Care | Payer: 59 | Attending: Adult Health | Admitting: Adult Health

## 2020-09-04 VITALS — BP 130/80 | HR 69 | Temp 98.0°F | Resp 14 | Ht 74.0 in | Wt 232.0 lb

## 2020-09-04 DIAGNOSIS — M25562 Pain in left knee: Secondary | ICD-10-CM

## 2020-09-04 NOTE — Progress Notes (Signed)
[facility] [address] [place], [place] [address]   Office Visit Note  Patient Name: Jose Moody  [id number]  [id number]  Date of Service: 09/04/2020  Chief Complaint  Patient presents with  . Medication Problem     HPI Patient is here reporting 8 weeks of left knee pain. He has been using Voltaren Gel with mild relief.  He tried gout medications with no relief. He is ambulatory with steady gait, no obvious limping.    Current Medication:  Outpatient Encounter Medications as of 09/04/2020  Medication Sig  . allopurinol (ZYLOPRIM) 100 MG tablet TAKE ONE TABLET BY MOUTH EVERY DAY  . atorvastatin (LIPITOR) 40 MG tablet Take 1 tablet (40 mg total) by mouth daily.  . ciclopirox (PENLAC) 8 % solution Apply topically at bedtime. Apply over nail and surrounding skin. Apply daily over previous coat. After seven (7) days, [place] remove with alcohol and continue cycle.  . diclofenac Sodium (VOLTAREN) 1 % GEL Apply 2 g topically 4 (four) times daily.  . Ergocalciferol (VITAMIN D2 PO) Take 5,000 Units by mouth.  . gabapentin (NEURONTIN) 300 MG capsule TAKE 1 CAPSULE BY MOUTH THREE TIMES DAILY  . indomethacin (INDOCIN) 50 MG capsule TAKE 1 CAPSULE BY MOUTH 3 TIMES DAILY WITH MEALS  . lisinopril-hydrochlorothiazide (ZESTORETIC) 20-25 MG tablet Take 1 tablet by mouth daily.  . Multiple Vitamin (MULTIVITAMIN) tablet Take 1 tablet by mouth daily.  Marland Kitchen omeprazole (PRILOSEC) 20 MG capsule TAKE 1 CAPSULE BY MOUTH DAILY.  . prazosin (MINIPRESS) 1 MG capsule Take 1 capsule (1 mg total) by mouth at bedtime.  . terbinafine (LAMISIL) 250 MG tablet Take 1 tablet (250 mg total) by mouth daily.   No facility-administered encounter medications on file as of 09/04/2020.      Medical History: Past Medical History:  Diagnosis Date  . Arthritis   . GERD (gastroesophageal reflux disease)   . Heart disease   . Hyperlipidemia   . Hypertension   . Joint pain   . Kidney stone      Vital Signs: BP  130/80   Pulse 69   Temp 98 F (36.7 C)   Resp 14   Ht 6\' 2"  (1.88 m)   Wt 232 lb (105.2 kg)   SpO2 98%   BMI 29.79 kg/m    Review of Systems  Musculoskeletal: Positive for arthralgias and joint swelling.    Physical Exam Constitutional:      General: He is not in acute distress.    Appearance: Normal appearance.  Musculoskeletal:        General: Swelling present.     Comments: Left knee mild swelling, no redness point tenderness over medial aspect of tibial head.  Neurological:     Mental Status: He is alert.     Assessment/Plan:  1. Acute pain of left knee Get X-ray of Knee as discussed. Continue current measures until x-ray is available.  - DG Knee Complete 4 Views Left; Future   General Counseling: hartman minahan understanding of the findings of todays visit and agrees with plan of treatment. I have discussed any further diagnostic evaluation that Proud be needed or ordered today. We also reviewed his medications today. he has been encouraged to call the office with any questions or concerns that should arise related to todays visit.   Orders Placed This Encounter  Procedures  . DG Knee Complete 4 Views Left    No orders of the defined types were placed in this encounter.  Time spent: 20 Minutes    Johnna Acosta AGNP-C Nurse Practitioner

## 2020-09-08 NOTE — Addendum Note (Signed)
Addended by: Gardner Candle on: 09/08/2020 08:45 AM   Modules accepted: Orders

## 2020-09-09 DIAGNOSIS — M1712 Unilateral primary osteoarthritis, left knee: Secondary | ICD-10-CM | POA: Diagnosis not present

## 2020-09-16 ENCOUNTER — Other Ambulatory Visit: Payer: Self-pay | Admitting: Physician Assistant

## 2020-09-16 DIAGNOSIS — M10061 Idiopathic gout, right knee: Secondary | ICD-10-CM

## 2020-09-18 ENCOUNTER — Other Ambulatory Visit: Payer: Self-pay

## 2020-09-18 NOTE — Telephone Encounter (Signed)
Rx refill request already in Epic - sent electronically by pharmacy. Re-routed to Blima Ledger, NP [facility] Interim Provider).  AMD

## 2020-09-23 DIAGNOSIS — M1712 Unilateral primary osteoarthritis, left knee: Secondary | ICD-10-CM | POA: Diagnosis not present

## 2020-10-27 ENCOUNTER — Other Ambulatory Visit: Payer: Self-pay | Admitting: Physician Assistant

## 2020-10-27 DIAGNOSIS — B351 Tinea unguium: Secondary | ICD-10-CM

## 2020-10-27 DIAGNOSIS — M25562 Pain in left knee: Secondary | ICD-10-CM

## 2020-10-29 ENCOUNTER — Other Ambulatory Visit: Payer: Self-pay

## 2020-10-29 NOTE — Telephone Encounter (Signed)
When I went to put in medication for refill, there was already an electronic refill request from the pharmacy for Terbinafine & Voltaren Cream.  Re-routed it to Blima Ledger, NP-C.  AMD

## 2020-12-28 ENCOUNTER — Other Ambulatory Visit: Payer: Self-pay | Admitting: Physician Assistant

## 2020-12-28 DIAGNOSIS — E782 Mixed hyperlipidemia: Secondary | ICD-10-CM

## 2021-01-21 DIAGNOSIS — M1712 Unilateral primary osteoarthritis, left knee: Secondary | ICD-10-CM | POA: Diagnosis not present

## 2021-01-26 DIAGNOSIS — D2310 Other benign neoplasm of skin of unspecified eyelid, including canthus: Secondary | ICD-10-CM | POA: Diagnosis not present

## 2021-02-09 ENCOUNTER — Ambulatory Visit: Payer: 59 | Admitting: Physician Assistant

## 2021-04-02 ENCOUNTER — Other Ambulatory Visit: Payer: Self-pay | Admitting: Physician Assistant

## 2021-04-02 DIAGNOSIS — M10061 Idiopathic gout, right knee: Secondary | ICD-10-CM

## 2021-04-06 ENCOUNTER — Other Ambulatory Visit: Payer: Self-pay

## 2021-04-06 ENCOUNTER — Ambulatory Visit: Payer: Self-pay

## 2021-04-06 DIAGNOSIS — Z0000 Encounter for general adult medical examination without abnormal findings: Secondary | ICD-10-CM

## 2021-04-06 LAB — POCT URINALYSIS DIPSTICK
Bilirubin, UA: NEGATIVE
Blood, UA: NEGATIVE
Glucose, UA: NEGATIVE
Ketones, UA: NEGATIVE
Leukocytes, UA: NEGATIVE
Nitrite, UA: NEGATIVE
Protein, UA: POSITIVE — AB
Spec Grav, UA: 1.015 (ref 1.010–1.025)
Urobilinogen, UA: 0.2 E.U./dL
pH, UA: 6.5 (ref 5.0–8.0)

## 2021-04-06 NOTE — Addendum Note (Signed)
Addended by: Christianne Dolin F on: 04/06/2021 09:28 AM   Modules accepted: Orders

## 2021-04-06 NOTE — Progress Notes (Signed)
Reviewed CDC recommendations for importance of HIV/ Hep C screening once in lifetime. Patient has declined HIV / Hep C screenings today and will let us know if they should change their mind in the future.

## 2021-04-07 LAB — CMP12+LP+TP+TSH+6AC+PSA+CBC…
ALT: 39 IU/L (ref 0–44)
AST: 26 IU/L (ref 0–40)
Albumin/Globulin Ratio: 2 (ref 1.2–2.2)
Albumin: 4.7 g/dL (ref 3.8–4.8)
Alkaline Phosphatase: 65 IU/L (ref 44–121)
BUN/Creatinine Ratio: 27 — ABNORMAL HIGH (ref 10–24)
BUN: 26 mg/dL (ref 8–27)
Basophils Absolute: 0 10*3/uL (ref 0.0–0.2)
Basos: 0 %
Bilirubin Total: 0.7 mg/dL (ref 0.0–1.2)
Calcium: 9.4 mg/dL (ref 8.6–10.2)
Chloride: 102 mmol/L (ref 96–106)
Chol/HDL Ratio: 3.9 ratio (ref 0.0–5.0)
Cholesterol, Total: 144 mg/dL (ref 100–199)
Creatinine, Ser: 0.96 mg/dL (ref 0.76–1.27)
EOS (ABSOLUTE): 0.1 10*3/uL (ref 0.0–0.4)
Eos: 2 %
Estimated CHD Risk: 0.7 times avg. (ref 0.0–1.0)
Free Thyroxine Index: 1.8 (ref 1.2–4.9)
GGT: 36 IU/L (ref 0–65)
Globulin, Total: 2.4 g/dL (ref 1.5–4.5)
Glucose: 103 mg/dL — ABNORMAL HIGH (ref 70–99)
HDL: 37 mg/dL — ABNORMAL LOW (ref >39)
Hematocrit: 49.5 % (ref 37.5–51.0)
Hemoglobin: 16.9 g/dL (ref 13.0–17.7)
Immature Grans (Abs): 0 10*3/uL (ref 0.0–0.1)
Immature Granulocytes: 0 %
Iron: 98 ug/dL (ref 38–169)
LDH: 198 IU/L (ref 121–224)
LDL Chol Calc (NIH): 73 mg/dL (ref 0–99)
Lymphocytes Absolute: 1.3 10*3/uL (ref 0.7–3.1)
Lymphs: 31 %
MCH: 31.8 pg (ref 26.6–33.0)
MCHC: 34.1 g/dL (ref 31.5–35.7)
MCV: 93 fL (ref 79–97)
Monocytes Absolute: 0.4 10*3/uL (ref 0.1–0.9)
Monocytes: 10 %
Neutrophils Absolute: 2.3 10*3/uL (ref 1.4–7.0)
Neutrophils: 57 %
Phosphorus: 2.6 mg/dL — ABNORMAL LOW (ref 2.8–4.1)
Platelets: 120 10*3/uL — ABNORMAL LOW (ref 150–450)
Potassium: 4.1 mmol/L (ref 3.5–5.2)
Prostate Specific Ag, Serum: 1.3 ng/mL (ref 0.0–4.0)
RBC: 5.32 x10E6/uL (ref 4.14–5.80)
RDW: 13 % (ref 11.6–15.4)
Sodium: 138 mmol/L (ref 134–144)
T3 Uptake Ratio: 29 % (ref 24–39)
T4, Total: 6.2 ug/dL (ref 4.5–12.0)
TSH: 1.88 u[IU]/mL (ref 0.450–4.500)
Total Protein: 7.1 g/dL (ref 6.0–8.5)
Triglycerides: 202 mg/dL — ABNORMAL HIGH (ref 0–149)
Uric Acid: 6.8 mg/dL (ref 3.8–8.4)
VLDL Cholesterol Cal: 34 mg/dL (ref 5–40)
WBC: 4.1 10*3/uL (ref 3.4–10.8)
eGFR: 89 mL/min/{1.73_m2} (ref >59)

## 2021-04-12 ENCOUNTER — Encounter: Payer: 59 | Admitting: Physician Assistant

## 2021-04-12 ENCOUNTER — Other Ambulatory Visit: Payer: Self-pay

## 2021-04-12 ENCOUNTER — Ambulatory Visit: Payer: Self-pay | Admitting: Physician Assistant

## 2021-04-12 ENCOUNTER — Encounter: Payer: Self-pay | Admitting: Physician Assistant

## 2021-04-12 ENCOUNTER — Other Ambulatory Visit: Payer: Self-pay | Admitting: Physician Assistant

## 2021-04-12 VITALS — BP 152/90 | HR 88 | Temp 98.2°F | Resp 14 | Ht 74.0 in | Wt 250.0 lb

## 2021-04-12 DIAGNOSIS — F5101 Primary insomnia: Secondary | ICD-10-CM

## 2021-04-12 DIAGNOSIS — I10 Essential (primary) hypertension: Secondary | ICD-10-CM

## 2021-04-12 DIAGNOSIS — Z0000 Encounter for general adult medical examination without abnormal findings: Secondary | ICD-10-CM

## 2021-04-12 DIAGNOSIS — G629 Polyneuropathy, unspecified: Secondary | ICD-10-CM

## 2021-04-12 DIAGNOSIS — G5723 Lesion of femoral nerve, bilateral lower limbs: Secondary | ICD-10-CM

## 2021-04-12 DIAGNOSIS — E782 Mixed hyperlipidemia: Secondary | ICD-10-CM

## 2021-04-12 MED ORDER — ATORVASTATIN CALCIUM 80 MG PO TABS: 80.0000 mg | ORAL_TABLET | Freq: Every day | ORAL | 3 refills | Status: DC

## 2021-04-12 MED ORDER — ALLOPURINOL 100 MG PO TABS: 100.0000 mg | ORAL_TABLET | Freq: Every day | ORAL | 6 refills | Status: DC

## 2021-04-12 MED ORDER — ZOLPIDEM TARTRATE 5 MG PO TABS: 5.0000 mg | ORAL_TABLET | Freq: Every evening | ORAL | 1 refills | Status: DC | PRN

## 2021-04-12 MED ORDER — GABAPENTIN 300 MG PO CAPS: 300.0000 mg | ORAL_CAPSULE | Freq: Three times a day (TID) | ORAL | 3 refills | Status: DC

## 2021-04-12 MED ORDER — LISINOPRIL-HYDROCHLOROTHIAZIDE 20-25 MG PO TABS: 1.0000 | ORAL_TABLET | Freq: Every day | ORAL | 2 refills | Status: DC

## 2021-04-12 NOTE — Progress Notes (Signed)
   Subjective: Annual physical exam    Patient ID: Jose Moody, male    DOB: 1955/06/26, 65 y.o.   MRN: [id number]  HPI Patient presented annual physical exam.  Patient is most concerned for increased neuropathy bilateral lower extremities.  Patient admits to not taking gabapentin as directed.  Patient also wished to discuss insomnia.  Patient states no trouble falling asleep but wakes up in the middle night around 2 or 3:00 in the morning.  This complaint is increased in the past 2 months.   Review of Systems DJD of the left knee and thoracic spine, GERD, hyperlipidemia, hypertension, and umbilical hernia.    Objective:   Physical Exam No acute distress.  Patient is wearing a knee brace on the left lower extremity.  Temperature is 98.2, pulse 88, respiration 14, BP is 150/90, and patient 90% O2 sat on room air.  Patient weighs 250 pounds and BMI is 32.10. HEENT is unremarkable.  Neck is supple without lymphadenopathy, bruit, or thyromegaly.  Lungs are clear to auscultation.  Heart is regular rate and rhythm.  No acute findings on EKG. Abdomen distended secondary to body habitus, normoactive bowel sounds, soft, nontender to palpation. No obvious deformity to the upper or lower extremities.  Patient has full and equal range of motion of the upper extremity.  Patient has decreased strength against resistance of the left lower extremity. No obvious cervical or lumbar spine deformity.  Patient has full and equal range of motion of the cervical and lumbar spine.   Cranial nerves II through XII are grossly intact.  DTR 2+ without clonus.       Assessment & Plan: Well exam.   Discussed lab results with patient showing increase of cholesterol and triglycerides.  Patient is currently taking atorvastatin 40 mg.  Patient is amenable to increasing atorvastatin from 40 to 80 mg for 3 months and repeat lipids.  Patient also advised on importance of diet and exercise.  Patient stated they will be  noncompliant secondary to holiday season.  Discussed patient bilateral neuropathy to the lower extremities.  Patient is not diabetic is a consult to neurology will be generated.  Patient that he does not want to see a neurologist until after the Thanksgiving holiday season.  Discussed insomnia with patient.  Patient will be given a prescription for Ambien 5 mg to take nightly.  Patient will follow-up in 2 weeks to reevaluate insomnia.

## 2021-04-12 NOTE — Progress Notes (Signed)
Pt wants to discuss neuropathy in both feet. He states that it is hereditary.

## 2021-04-26 ENCOUNTER — Other Ambulatory Visit: Payer: Self-pay | Admitting: Physician Assistant

## 2021-04-26 MED ORDER — ZOLPIDEM TARTRATE 5 MG PO TABS: 5.0000 mg | ORAL_TABLET | Freq: Every evening | ORAL | 1 refills | Status: DC | PRN

## 2021-04-27 ENCOUNTER — Encounter: Payer: Self-pay | Admitting: Physician Assistant

## 2021-04-27 ENCOUNTER — Ambulatory Visit: Payer: Self-pay | Admitting: Physician Assistant

## 2021-04-27 ENCOUNTER — Other Ambulatory Visit: Payer: Self-pay

## 2021-04-27 VITALS — BP 169/92 | HR 80 | Temp 98.2°F | Resp 14 | Ht 74.0 in | Wt 250.0 lb

## 2021-04-27 DIAGNOSIS — H5203 Hypermetropia, bilateral: Secondary | ICD-10-CM | POA: Diagnosis not present

## 2021-04-27 DIAGNOSIS — H0288A Meibomian gland dysfunction right eye, upper and lower eyelids: Secondary | ICD-10-CM | POA: Diagnosis not present

## 2021-04-27 DIAGNOSIS — F5101 Primary insomnia: Secondary | ICD-10-CM

## 2021-04-27 DIAGNOSIS — H0288B Meibomian gland dysfunction left eye, upper and lower eyelids: Secondary | ICD-10-CM | POA: Diagnosis not present

## 2021-04-27 DIAGNOSIS — F411 Generalized anxiety disorder: Secondary | ICD-10-CM

## 2021-04-27 DIAGNOSIS — H02883 Meibomian gland dysfunction of right eye, unspecified eyelid: Secondary | ICD-10-CM | POA: Diagnosis not present

## 2021-04-27 DIAGNOSIS — D3102 Benign neoplasm of left conjunctiva: Secondary | ICD-10-CM | POA: Diagnosis not present

## 2021-04-27 MED ORDER — ZOLPIDEM TARTRATE 10 MG PO TABS: 10.0000 mg | ORAL_TABLET | Freq: Every evening | ORAL | 0 refills | Status: DC | PRN

## 2021-04-27 MED ORDER — ALPRAZOLAM 0.25 MG PO TABS: 0.2500 mg | ORAL_TABLET | Freq: Two times a day (BID) | ORAL | 0 refills | Status: DC | PRN

## 2021-04-27 NOTE — Progress Notes (Signed)
Pt having issues staying asleep. Not having issues with going to sleep just staying asleep. He doesn't know if he has anxiety or depression but its getting worse.Jose Moody

## 2021-04-27 NOTE — Progress Notes (Signed)
   Subjective: Anxiety and insomnia    Patient ID: Jose Moody, male    DOB: 05/13/1956, 65 y.o.   MRN: [id number]  HPI Patient here today for 2-week follow-up for insomnia.  Patient states taking 5 mg of Ambien he is able to sleep but wakes up around 2 or 3 in the morning.  Patient stated increased personal stress with the death of father and brother.  Patient also state another brother has just been released from a nursing home and he must start assisting with care.  Patient states episodes of generalized anxiety and trying to deal with taking care of his family health problems.  Review of Systems Anxiety, gout, hyperlipidemia, and insomnia.    Objective:   Physical Exam Patient appears anxious.  Speaking in for short bursts.  Temperature 98.2, pulse 80, respiration 14, BP is 169/92, patient 95% O2 sat on room air.  Patient weighs 250 pounds BMI is 32.1. HEENT.  Neck is supple for       Assessment & Plan: Anxiety and insomnia.   Patient Ambien will decrease to 10 mg.  Patient given 2-week course of Xanax 0.25 mg twice daily.  Patient will follow-up in 2 weeks.

## 2021-05-03 ENCOUNTER — Other Ambulatory Visit: Payer: Self-pay

## 2021-05-03 DIAGNOSIS — B351 Tinea unguium: Secondary | ICD-10-CM

## 2021-05-03 MED ORDER — TERBINAFINE HCL 250 MG PO TABS: 250.0000 mg | ORAL_TABLET | Freq: Every day | ORAL | 1 refills | Status: DC

## 2021-05-07 ENCOUNTER — Other Ambulatory Visit: Payer: Self-pay | Admitting: Physician Assistant

## 2021-05-11 ENCOUNTER — Other Ambulatory Visit: Payer: Self-pay

## 2021-05-11 ENCOUNTER — Ambulatory Visit: Payer: Self-pay | Admitting: Physician Assistant

## 2021-05-11 ENCOUNTER — Encounter: Payer: Self-pay | Admitting: Physician Assistant

## 2021-05-11 VITALS — BP 143/90 | HR 66 | Temp 97.8°F | Resp 14 | Ht 74.0 in | Wt 250.0 lb

## 2021-05-11 DIAGNOSIS — F5101 Primary insomnia: Secondary | ICD-10-CM

## 2021-05-11 DIAGNOSIS — F411 Generalized anxiety disorder: Secondary | ICD-10-CM

## 2021-05-11 MED ORDER — PAROXETINE HCL 20 MG PO TABS: 20.0000 mg | ORAL_TABLET | Freq: Every day | ORAL | 0 refills | Status: DC

## 2021-05-11 NOTE — Progress Notes (Signed)
Pt states sleep and anxiety has gotten a lot better.Jose Moody

## 2021-05-11 NOTE — Progress Notes (Signed)
   Subjective: Anxiety and insomnia    Patient ID: Jose Moody, male    DOB: 15-Mar-1956, 65 y.o.   MRN: [id number]  HPI  Patient presents with 2 weeks evaluation for anxiety and insomnia.  Patient Ambien was increased from 5 mg to 10 mg.  Patient continues to take Xanax 0.25 mg on a as needed basis.  Patient state improvement in sleep and decreased and anxiety in dealing with family stressors.  Patient is requesting a maintenance medicine instead of a as needed medicine for anxiety.  Patient also states improvement in hypertension with lisinopril.  Continuing on diet and status to improve hyperlipidemia state.  Review of Systems Anxiety, hyperlipidemia, and insomnia.     Objective:   Physical Exam No acute distress.  Temperature 97.8, pulse 66, respiration 14, BP is 143/90, patient 97% O2 sat on room air.  Patient weighs 250 pounds BMI is 32.1.  Patient patient appears more relaxed intermittently.     Assessment & Plan: Anxiety and insomnia.   Patient will continue Ambien at 10 mg.  Patient will start Paxil 20 mg while discontinuing Xanax.  Patient will follow-up in 2 weeks for reevaluation.

## 2021-05-12 ENCOUNTER — Other Ambulatory Visit: Payer: Self-pay | Admitting: Physician Assistant

## 2021-05-20 ENCOUNTER — Other Ambulatory Visit: Payer: Self-pay | Admitting: Physician Assistant

## 2021-05-20 MED ORDER — INDOMETHACIN 50 MG PO CAPS: 50.0000 mg | ORAL_CAPSULE | Freq: Three times a day (TID) | ORAL | 0 refills | Status: DC

## 2021-05-27 ENCOUNTER — Other Ambulatory Visit: Payer: Self-pay

## 2021-05-27 DIAGNOSIS — F5101 Primary insomnia: Secondary | ICD-10-CM

## 2021-05-27 MED ORDER — ZOLPIDEM TARTRATE 10 MG PO TABS: 10.0000 mg | ORAL_TABLET | Freq: Every evening | ORAL | 0 refills | Status: DC | PRN

## 2021-06-02 ENCOUNTER — Encounter: Payer: Self-pay | Admitting: Physician Assistant

## 2021-06-02 ENCOUNTER — Other Ambulatory Visit: Payer: Self-pay | Admitting: Physician Assistant

## 2021-06-02 ENCOUNTER — Other Ambulatory Visit: Payer: Self-pay

## 2021-06-02 ENCOUNTER — Ambulatory Visit: Payer: Self-pay | Admitting: Physician Assistant

## 2021-06-02 VITALS — BP 154/98 | HR 84 | Temp 98.2°F | Resp 14 | Ht 74.0 in | Wt 244.0 lb

## 2021-06-02 DIAGNOSIS — K219 Gastro-esophageal reflux disease without esophagitis: Secondary | ICD-10-CM

## 2021-06-02 DIAGNOSIS — F5101 Primary insomnia: Secondary | ICD-10-CM

## 2021-06-02 NOTE — Progress Notes (Signed)
Pt presents today for f/u for sleep rx and pt states its doing good for him but he did have to cut the paxil in half due to side affects. Low sex drive and vision issues.

## 2021-06-02 NOTE — Progress Notes (Signed)
° °  Subjective: Follow-up for insomnia    Patient ID: Jose Moody, male    DOB: 1956-04-25, 66 y.o.   MRN: [id number]  HPI Patient started Ambien for insomnia.  Patient states sleeping well.  Patient was concerned of interaction of Ambien and Paxil.  Patient state he knows erectile dysfunction after starting Paxil.  Patient currently taking 20 mg but he can use a pill cutter to cut Paxil in half to 20 mg.  Patient states he believes this is helping with his erectile dysfunction.   Review of Systems Anxiety, erectile dysfunction, gout, hyperlipidemia, and hypertension.    Objective:   Physical Exam No acute distress.  Temperature 98.2, pulse 84, respiration 14, BP 154/90, and patient 97% O2 sat on room air.  Patient weighs 2044 pounds and BMI is 31.33. HEENT is unremarkable.  Neck is supple for lymphadenopathy or bruits.  Lungs clear to auscultation.  Heart regular rate and rhythm.       Assessment & Plan: Anxiety.   Discussed the uses of Ambien and Paxil.  Patient will continue Ambien at 10 mg and use a pill cutter to decrease Paxil to 10 mg.  Patient advised to follow-up in 2 weeks sooner if condition worsens.

## 2021-06-08 ENCOUNTER — Other Ambulatory Visit: Payer: Self-pay | Admitting: Physician Assistant

## 2021-06-08 DIAGNOSIS — F5101 Primary insomnia: Secondary | ICD-10-CM

## 2021-06-08 DIAGNOSIS — F411 Generalized anxiety disorder: Secondary | ICD-10-CM

## 2021-06-09 ENCOUNTER — Other Ambulatory Visit: Payer: Self-pay

## 2021-06-09 NOTE — Telephone Encounter (Signed)
Received fax from [facility] for Rx refill request for Omeprazole 20 mg.  Upon entering Rx refill info into Epic, a box opened up for a pended Rx refill request that was sent to [facility] electronically by [facility].  Re-routed the pended Rx refill request to Durward Parcel, PA-C.  AMD

## 2021-07-14 ENCOUNTER — Other Ambulatory Visit: Payer: Self-pay

## 2021-07-14 DIAGNOSIS — Z0000 Encounter for general adult medical examination without abnormal findings: Secondary | ICD-10-CM

## 2021-07-14 NOTE — Progress Notes (Signed)
Pt completed labs for lipid.

## 2021-07-15 LAB — CMP12+LP+TP+TSH+6AC+PSA+CBC…
ALT: 37 IU/L (ref 0–44)
AST: 30 IU/L (ref 0–40)
Albumin/Globulin Ratio: 2.1 (ref 1.2–2.2)
Albumin: 4.7 g/dL (ref 3.8–4.8)
Alkaline Phosphatase: 82 IU/L (ref 44–121)
BUN/Creatinine Ratio: 15 (ref 10–24)
BUN: 15 mg/dL (ref 8–27)
Basophils Absolute: 0 10*3/uL (ref 0.0–0.2)
Basos: 0 %
Bilirubin Total: 0.7 mg/dL (ref 0.0–1.2)
Calcium: 9.8 mg/dL (ref 8.6–10.2)
Chloride: 100 mmol/L (ref 96–106)
Chol/HDL Ratio: 4.4 ratio (ref 0.0–5.0)
Cholesterol, Total: 154 mg/dL (ref 100–199)
Creatinine, Ser: 1 mg/dL (ref 0.76–1.27)
EOS (ABSOLUTE): 0.1 10*3/uL (ref 0.0–0.4)
Eos: 1 %
Estimated CHD Risk: 0.9 times avg. (ref 0.0–1.0)
Free Thyroxine Index: 2.4 (ref 1.2–4.9)
GGT: 44 IU/L (ref 0–65)
Globulin, Total: 2.2 g/dL (ref 1.5–4.5)
Glucose: 127 mg/dL — ABNORMAL HIGH (ref 70–99)
HDL: 35 mg/dL — ABNORMAL LOW (ref >39)
Hematocrit: 53.6 % — ABNORMAL HIGH (ref 37.5–51.0)
Hemoglobin: 18.3 g/dL — ABNORMAL HIGH (ref 13.0–17.7)
Immature Grans (Abs): 0 10*3/uL (ref 0.0–0.1)
Immature Granulocytes: 0 %
Iron: 111 ug/dL (ref 38–169)
LDH: 200 IU/L (ref 121–224)
LDL Chol Calc (NIH): 67 mg/dL (ref 0–99)
Lymphocytes Absolute: 1.3 10*3/uL (ref 0.7–3.1)
Lymphs: 26 %
MCH: 32 pg (ref 26.6–33.0)
MCHC: 34.1 g/dL (ref 31.5–35.7)
MCV: 94 fL (ref 79–97)
Monocytes Absolute: 0.5 10*3/uL (ref 0.1–0.9)
Monocytes: 9 %
Neutrophils Absolute: 3.2 10*3/uL (ref 1.4–7.0)
Neutrophils: 64 %
Phosphorus: 3 mg/dL (ref 2.8–4.1)
Platelets: 143 10*3/uL — ABNORMAL LOW (ref 150–450)
Potassium: 4.1 mmol/L (ref 3.5–5.2)
Prostate Specific Ag, Serum: 1.4 ng/mL (ref 0.0–4.0)
RBC: 5.71 x10E6/uL (ref 4.14–5.80)
RDW: 12.8 % (ref 11.6–15.4)
Sodium: 137 mmol/L (ref 134–144)
T3 Uptake Ratio: 32 % (ref 24–39)
T4, Total: 7.4 ug/dL (ref 4.5–12.0)
TSH: 2.46 u[IU]/mL (ref 0.450–4.500)
Total Protein: 6.9 g/dL (ref 6.0–8.5)
Triglycerides: 331 mg/dL — ABNORMAL HIGH (ref 0–149)
Uric Acid: 6.5 mg/dL (ref 3.8–8.4)
VLDL Cholesterol Cal: 52 mg/dL — ABNORMAL HIGH (ref 5–40)
WBC: 5 10*3/uL (ref 3.4–10.8)
eGFR: 85 mL/min/{1.73_m2} (ref >59)

## 2021-07-20 ENCOUNTER — Ambulatory Visit: Payer: Self-pay | Admitting: Physician Assistant

## 2021-07-26 ENCOUNTER — Other Ambulatory Visit: Payer: Self-pay

## 2021-07-26 ENCOUNTER — Encounter: Payer: Self-pay | Admitting: Physician Assistant

## 2021-07-26 ENCOUNTER — Ambulatory Visit: Payer: Self-pay | Admitting: Physician Assistant

## 2021-07-26 VITALS — BP 142/80 | HR 74 | Temp 97.7°F | Resp 14 | Ht 74.0 in | Wt 247.0 lb

## 2021-07-26 DIAGNOSIS — E782 Mixed hyperlipidemia: Secondary | ICD-10-CM

## 2021-07-26 NOTE — Progress Notes (Signed)
° °  Subjective: Hyperlipidemia    Patient ID: Jose Moody, male    DOB: 13-Mar-1956, 66 y.o.   MRN: [id number]  HPI Patient here today for 45-month follow-up of hyperlipidemia.  Patient admits to noncompliance with diet but stated he is taking his Lipitor as directed.  Also noticed an increase of the patient's glucose from last exam.  Review of Systems Anxiety, insomnia, gout, hyperlipidemia, and hypertension.    Objective:   Physical Exam  No acute distress.  Temperature is 97.7, pulse 74, respiration 14, BP is 142/80, patient 97% O2 sat on room air.  Patient weighs 247 pounds and BMI is 31.71. HEENT is unremarkable.  Neck is supple without lymphadenopathy or bruits.  Lungs are clear to auscultation.  Heart regular rate and rhythm. Cholesterol is 151, triglycerides at 331, HDL is 35, VLDL cholesterol calculation is 52, and LDL cholesterol calculation is 67.     Assessment & Plan: Hyperlipidemia  Advised patient to continue previous medication and have a fasting labs done along with a hemoglobin A1c in 3 months.  Advised to follow strict dietary measures to supplement his medication regimen.

## 2021-08-03 DIAGNOSIS — D3102 Benign neoplasm of left conjunctiva: Secondary | ICD-10-CM | POA: Diagnosis not present

## 2021-08-03 DIAGNOSIS — D239 Other benign neoplasm of skin, unspecified: Secondary | ICD-10-CM | POA: Diagnosis not present

## 2021-08-03 DIAGNOSIS — L578 Other skin changes due to chronic exposure to nonionizing radiation: Secondary | ICD-10-CM | POA: Diagnosis not present

## 2021-08-09 ENCOUNTER — Other Ambulatory Visit: Payer: Self-pay | Admitting: Physician Assistant

## 2021-08-09 DIAGNOSIS — B351 Tinea unguium: Secondary | ICD-10-CM

## 2021-08-19 ENCOUNTER — Other Ambulatory Visit: Payer: Self-pay

## 2021-08-19 DIAGNOSIS — F411 Generalized anxiety disorder: Secondary | ICD-10-CM

## 2021-08-19 DIAGNOSIS — I10 Essential (primary) hypertension: Secondary | ICD-10-CM

## 2021-08-19 MED ORDER — LISINOPRIL-HYDROCHLOROTHIAZIDE 20-25 MG PO TABS: 1.0000 | ORAL_TABLET | Freq: Every day | ORAL | 3 refills | Status: DC

## 2021-08-19 MED ORDER — PAROXETINE HCL 10 MG PO TABS: 10.0000 mg | ORAL_TABLET | Freq: Every day | ORAL | 3 refills | Status: DC

## 2021-08-21 ENCOUNTER — Other Ambulatory Visit: Payer: Self-pay | Admitting: Physician Assistant

## 2021-08-21 DIAGNOSIS — F5101 Primary insomnia: Secondary | ICD-10-CM

## 2021-09-13 ENCOUNTER — Encounter: Payer: Self-pay | Admitting: Physician Assistant

## 2021-09-13 ENCOUNTER — Other Ambulatory Visit: Payer: Self-pay

## 2021-09-24 ENCOUNTER — Other Ambulatory Visit: Payer: Self-pay | Admitting: Physician Assistant

## 2021-09-24 DIAGNOSIS — F411 Generalized anxiety disorder: Secondary | ICD-10-CM

## 2021-09-29 ENCOUNTER — Other Ambulatory Visit: Payer: Self-pay

## 2021-09-29 DIAGNOSIS — M10061 Idiopathic gout, right knee: Secondary | ICD-10-CM

## 2021-09-29 DIAGNOSIS — E782 Mixed hyperlipidemia: Secondary | ICD-10-CM

## 2021-09-30 MED ORDER — ALLOPURINOL 100 MG PO TABS: 100.0000 mg | ORAL_TABLET | Freq: Every day | ORAL | 6 refills | Status: DC

## 2021-09-30 MED ORDER — ATORVASTATIN CALCIUM 80 MG PO TABS: 80.0000 mg | ORAL_TABLET | Freq: Every day | ORAL | 3 refills | Status: AC

## 2021-09-30 NOTE — Telephone Encounter (Signed)
Renewed at patient request. Up to date office care. ?

## 2021-09-30 NOTE — Telephone Encounter (Signed)
Patient  request refills of Atorvastation/ Lipitor and Allopurinol/Zyloprim. ?Up to date with annual exams, medication current per provider. ?Rx renewed ?

## 2021-10-13 ENCOUNTER — Encounter: Payer: Self-pay | Admitting: Physician Assistant

## 2021-10-13 ENCOUNTER — Ambulatory Visit: Payer: Self-pay | Admitting: Physician Assistant

## 2021-10-13 DIAGNOSIS — K641 Second degree hemorrhoids: Secondary | ICD-10-CM

## 2021-10-13 MED ORDER — LIDOCAINE (ANORECTAL) 5 % EX CREA: 1.0000 "application " | TOPICAL_CREAM | Freq: Four times a day (QID) | CUTANEOUS | 0 refills | Status: DC

## 2021-10-13 MED ORDER — DOCUSATE SODIUM 100 MG PO CAPS: 100.0000 mg | ORAL_CAPSULE | Freq: Two times a day (BID) | ORAL | 0 refills | Status: DC

## 2021-10-13 MED ORDER — HYDROCORTISONE ACETATE 25 MG RE SUPP: 25.0000 mg | Freq: Two times a day (BID) | RECTAL | 1 refills | Status: DC

## 2021-10-13 NOTE — Progress Notes (Signed)
? ?  Subjective: Rectal hemorrhoid  ? ? Patient ID: Jose Moody, male    DOB: Jose Moody 20, 1957, 66 y.o.   MRN: [id number] ? ?HPI ?Patient presents for rectal hemorrhoid.  Patient states he felt a protrusion from his anus status post bowel movement yesterday.  States stools are soft and states only minimal bleeding.  It is noted that the patient has lost 15 pounds due to diet and exercise in the past year.  Patient continues to take Lipitor for hyperlipidemia. ? ? ?Review of Systems ?Anxiety, gout, hyperlipidemia, and hypertension. ?   ?Objective:  ? Physical Exam ? ?See nurses notes. ?Rectal exam reveals nonthrombosed hemorrhoid tissue exiting from the anus.  Patient was guaiac negative for rectal exam. ? ? ?   ?Assessment & Plan: Rectal hemorrhoid  ?Advised conservative treatment at this time consist of use Anusol, Colace, and topical lidocaine.  Patient will follow-up in 1 week for reevaluation. ? ?

## 2021-10-13 NOTE — Progress Notes (Signed)
Pt presents today with complaint of hemorrhoids. Pt tried getting up from the couch yesterday and felt pressure at rectum as if something popped out.  ?

## 2021-10-18 ENCOUNTER — Encounter: Payer: Self-pay | Admitting: Physician Assistant

## 2021-10-18 ENCOUNTER — Ambulatory Visit: Payer: Self-pay | Admitting: Physician Assistant

## 2021-10-18 DIAGNOSIS — K641 Second degree hemorrhoids: Secondary | ICD-10-CM

## 2021-10-18 NOTE — Progress Notes (Signed)
Pt presents today for follow up. Pt has pad on because hemorrhoids split Saturday night.

## 2021-10-18 NOTE — Progress Notes (Signed)
   Subjective: Rectal hemorrhoids    Patient ID: Jose Moody, male    DOB: 07-19-1955, 66 y.o.   MRN: [id number]  HPI Patient is following up for rectal hemorrhoids.  Patient was seen on 10/13/2021 and given a prescription for Anusol suppositories and Colace.  Patient states 2 days ago started noticing slight bleeding with bowel movements.  Patient is a hemorrhoid has decreased in size.  States no pain at this time.   Review of Systems Anxiety, gout, hyperlipidemia, and hypertension    Objective:   Physical Exam See nurses note for vital signs. Examination external hemorrhoid shows remarkable decrease in size.  Lesion is nonthrombosed.  Scant blood noted on pad.       Assessment & Plan: Resolving hemorrhoid  Continue treatment plan with addition of sitz bath.  Follow-up as necessary.

## 2021-10-29 ENCOUNTER — Other Ambulatory Visit: Payer: Self-pay

## 2021-10-29 DIAGNOSIS — E782 Mixed hyperlipidemia: Secondary | ICD-10-CM

## 2021-10-30 LAB — LIPID PANEL
Chol/HDL Ratio: 3.3 ratio (ref 0.0–5.0)
Cholesterol, Total: 112 mg/dL (ref 100–199)
HDL: 34 mg/dL — ABNORMAL LOW (ref >39)
LDL Chol Calc (NIH): 44 mg/dL (ref 0–99)
Triglycerides: 210 mg/dL — ABNORMAL HIGH (ref 0–149)
VLDL Cholesterol Cal: 34 mg/dL (ref 5–40)

## 2021-11-08 ENCOUNTER — Other Ambulatory Visit: Payer: Self-pay

## 2021-11-08 DIAGNOSIS — F5101 Primary insomnia: Secondary | ICD-10-CM

## 2021-11-08 NOTE — Telephone Encounter (Signed)
Caryn Bee called requesting Rx refill for Xanax.  States [facility] was supposed to have sent a refill request to Durward Parcel, PA-C.  Upon entering Rx refill info into Epic, a box opened up for a pended Rx refill request for Xanax that was sent electronically by  [facility] to [facility].  Re-routed the pended Rx refill request to Nona Dell, PA-C.  AMD

## 2021-11-09 DIAGNOSIS — G4700 Insomnia, unspecified: Secondary | ICD-10-CM | POA: Insufficient documentation

## 2021-11-09 DIAGNOSIS — F411 Generalized anxiety disorder: Secondary | ICD-10-CM | POA: Insufficient documentation

## 2021-11-25 ENCOUNTER — Other Ambulatory Visit: Payer: Self-pay

## 2021-11-25 DIAGNOSIS — B351 Tinea unguium: Secondary | ICD-10-CM

## 2021-12-02 NOTE — Telephone Encounter (Signed)
Overdue for 3 month follow up and labs at office- please schedule and discuss Rx with provider at that time   Thank you    [facility]

## 2021-12-03 ENCOUNTER — Other Ambulatory Visit: Payer: Self-pay

## 2021-12-03 DIAGNOSIS — B351 Tinea unguium: Secondary | ICD-10-CM

## 2021-12-08 ENCOUNTER — Other Ambulatory Visit: Payer: Self-pay

## 2021-12-08 DIAGNOSIS — B351 Tinea unguium: Secondary | ICD-10-CM

## 2021-12-08 MED ORDER — TERBINAFINE HCL 250 MG PO TABS: 250.0000 mg | ORAL_TABLET | Freq: Every day | ORAL | 1 refills | Status: DC

## 2021-12-23 ENCOUNTER — Ambulatory Visit: Payer: Self-pay | Admitting: Physician Assistant

## 2021-12-23 DIAGNOSIS — F5101 Primary insomnia: Secondary | ICD-10-CM

## 2021-12-23 DIAGNOSIS — F411 Generalized anxiety disorder: Secondary | ICD-10-CM

## 2021-12-23 MED ORDER — ZOLPIDEM TARTRATE 10 MG PO TABS: 10.0000 mg | ORAL_TABLET | Freq: Every evening | ORAL | 0 refills | Status: DC | PRN

## 2021-12-23 MED ORDER — ALPRAZOLAM 0.25 MG PO TABS: 0.2500 mg | ORAL_TABLET | Freq: Two times a day (BID) | ORAL | 0 refills | Status: DC | PRN

## 2021-12-23 NOTE — Progress Notes (Signed)
Pt has some questions about getting off some medication. Pt brought list of medication.

## 2021-12-23 NOTE — Progress Notes (Signed)
   Subjective: Anxiety and insomnia    Patient ID: Jose Moody, male    DOB: Feb 19, 1956, 67 y.o.   MRN: [id number]  HPI Patient is requesting evaluation of medicine for anxiety and insomnia.  Patient will his sleep log recorded on his iPhone showed good results with Ambien.  Patient states he keeps an average of 7 hours of sleep with taking Ambien but only 3 hours of sleep when he did not take the Ambien.  He is requesting to go on monthly supply of Ambien.  Patient also states that he cannot tolerate Paxil secondary to drowsy effects while driving.  Patient states secondary to family stressors he has to do repetitive long distance driving.  Patient states he does not have the drowsy effects while taking Xanax.  Requesting change of medications Xanax.   Review of Systems Anxiety and insomnia.    Objective:   Physical Exam  Deferred      Assessment & Plan: Anxiety and insomnia  Patient will discontinue Paxil.  Patient will take Xanax at 0.25 mg twice daily.  Patient given prescription for Ambien 10 mg to take nightly.  Patient vies follow-up in 2 weeks.

## 2021-12-28 ENCOUNTER — Telehealth: Payer: Self-pay

## 2021-12-28 DIAGNOSIS — Z9889 Other specified postprocedural states: Secondary | ICD-10-CM

## 2021-12-28 NOTE — Telephone Encounter (Signed)
Jose Moody called stating he called [facility] & they told him to have [name] send a referral for his follow-up colonoscopy.  AMD

## 2022-02-02 ENCOUNTER — Other Ambulatory Visit: Payer: Self-pay

## 2022-02-02 DIAGNOSIS — F5101 Primary insomnia: Secondary | ICD-10-CM

## 2022-02-02 MED ORDER — ZOLPIDEM TARTRATE 10 MG PO TABS: 10.0000 mg | ORAL_TABLET | Freq: Every evening | ORAL | 5 refills | Status: DC | PRN

## 2022-02-09 ENCOUNTER — Other Ambulatory Visit: Payer: Self-pay | Admitting: Physician Assistant

## 2022-02-09 DIAGNOSIS — G629 Polyneuropathy, unspecified: Secondary | ICD-10-CM

## 2022-02-09 DIAGNOSIS — F411 Generalized anxiety disorder: Secondary | ICD-10-CM

## 2022-02-09 MED ORDER — ALPRAZOLAM 0.25 MG PO TABS: 0.2500 mg | ORAL_TABLET | Freq: Two times a day (BID) | ORAL | 2 refills | Status: DC | PRN

## 2022-02-09 NOTE — Progress Notes (Signed)
xa

## 2022-03-08 ENCOUNTER — Encounter: Payer: Self-pay | Admitting: Physician Assistant

## 2022-03-23 ENCOUNTER — Other Ambulatory Visit: Payer: Self-pay | Admitting: Physician Assistant

## 2022-03-23 DIAGNOSIS — B351 Tinea unguium: Secondary | ICD-10-CM

## 2022-03-29 DIAGNOSIS — Z1211 Encounter for screening for malignant neoplasm of colon: Secondary | ICD-10-CM | POA: Diagnosis not present

## 2022-03-29 DIAGNOSIS — Z8719 Personal history of other diseases of the digestive system: Secondary | ICD-10-CM | POA: Diagnosis not present

## 2022-03-29 DIAGNOSIS — K2100 Gastro-esophageal reflux disease with esophagitis, without bleeding: Secondary | ICD-10-CM | POA: Diagnosis not present

## 2022-03-30 DIAGNOSIS — M1712 Unilateral primary osteoarthritis, left knee: Secondary | ICD-10-CM | POA: Diagnosis not present

## 2022-04-01 ENCOUNTER — Ambulatory Visit: Payer: 59

## 2022-04-01 DIAGNOSIS — Z0000 Encounter for general adult medical examination without abnormal findings: Secondary | ICD-10-CM

## 2022-04-01 LAB — POCT URINALYSIS DIPSTICK
Bilirubin, UA: NEGATIVE
Blood, UA: NEGATIVE
Glucose, UA: NEGATIVE
Ketones, UA: NEGATIVE
Leukocytes, UA: NEGATIVE
Nitrite, UA: NEGATIVE
Protein, UA: NEGATIVE
Spec Grav, UA: 1.015 (ref 1.010–1.025)
Urobilinogen, UA: 0.2 E.U./dL
pH, UA: 7 (ref 5.0–8.0)

## 2022-04-01 NOTE — Addendum Note (Signed)
Addended by: Malachy Moan F on: 04/01/2022 10:09 AM   Modules accepted: Orders

## 2022-04-01 NOTE — Progress Notes (Signed)
Pt presents today for annual physical labs. Pt will return for scheduled physical.

## 2022-04-02 ENCOUNTER — Other Ambulatory Visit: Payer: Self-pay | Admitting: Physician Assistant

## 2022-04-02 DIAGNOSIS — K219 Gastro-esophageal reflux disease without esophagitis: Secondary | ICD-10-CM

## 2022-04-02 LAB — CMP12+LP+TP+TSH+6AC+PSA+CBC…
ALT: 44 IU/L (ref 0–44)
AST: 29 IU/L (ref 0–40)
Albumin/Globulin Ratio: 2.1 (ref 1.2–2.2)
Albumin: 4.8 g/dL (ref 3.9–4.9)
Alkaline Phosphatase: 73 IU/L (ref 44–121)
BUN/Creatinine Ratio: 12 (ref 10–24)
BUN: 13 mg/dL (ref 8–27)
Basophils Absolute: 0 10*3/uL (ref 0.0–0.2)
Basos: 0 %
Bilirubin Total: 0.7 mg/dL (ref 0.0–1.2)
Calcium: 9.7 mg/dL (ref 8.6–10.2)
Chloride: 97 mmol/L (ref 96–106)
Chol/HDL Ratio: 3.7 ratio (ref 0.0–5.0)
Cholesterol, Total: 128 mg/dL (ref 100–199)
Creatinine, Ser: 1.11 mg/dL (ref 0.76–1.27)
EOS (ABSOLUTE): 0.1 10*3/uL (ref 0.0–0.4)
Eos: 1 %
Estimated CHD Risk: 0.6 times avg. (ref 0.0–1.0)
Free Thyroxine Index: 1.7 (ref 1.2–4.9)
GGT: 43 IU/L (ref 0–65)
Globulin, Total: 2.3 g/dL (ref 1.5–4.5)
Glucose: 110 mg/dL — ABNORMAL HIGH (ref 70–99)
HDL: 35 mg/dL — ABNORMAL LOW (ref >39)
Hematocrit: 50.1 % (ref 37.5–51.0)
Hemoglobin: 17.7 g/dL (ref 13.0–17.7)
Immature Grans (Abs): 0 10*3/uL (ref 0.0–0.1)
Immature Granulocytes: 0 %
Iron: 133 ug/dL (ref 38–169)
LDH: 198 IU/L (ref 121–224)
LDL Chol Calc (NIH): 58 mg/dL (ref 0–99)
Lymphocytes Absolute: 1.1 10*3/uL (ref 0.7–3.1)
Lymphs: 23 %
MCH: 32.7 pg (ref 26.6–33.0)
MCHC: 35.3 g/dL (ref 31.5–35.7)
MCV: 93 fL (ref 79–97)
Monocytes Absolute: 0.5 10*3/uL (ref 0.1–0.9)
Monocytes: 9 %
Neutrophils Absolute: 3.4 10*3/uL (ref 1.4–7.0)
Neutrophils: 67 %
Phosphorus: 2.7 mg/dL — ABNORMAL LOW (ref 2.8–4.1)
Platelets: 130 10*3/uL — ABNORMAL LOW (ref 150–450)
Potassium: 3.9 mmol/L (ref 3.5–5.2)
Prostate Specific Ag, Serum: 1.2 ng/mL (ref 0.0–4.0)
RBC: 5.41 x10E6/uL (ref 4.14–5.80)
RDW: 12.9 % (ref 11.6–15.4)
Sodium: 135 mmol/L (ref 134–144)
T3 Uptake Ratio: 30 % (ref 24–39)
T4, Total: 5.8 ug/dL (ref 4.5–12.0)
TSH: 1.92 u[IU]/mL (ref 0.450–4.500)
Total Protein: 7.1 g/dL (ref 6.0–8.5)
Triglycerides: 217 mg/dL — ABNORMAL HIGH (ref 0–149)
Uric Acid: 6.9 mg/dL (ref 3.8–8.4)
VLDL Cholesterol Cal: 35 mg/dL (ref 5–40)
WBC: 5.1 10*3/uL (ref 3.4–10.8)
eGFR: 75 mL/min/{1.73_m2} (ref >59)

## 2022-04-04 ENCOUNTER — Ambulatory Visit: Payer: Self-pay | Admitting: Physician Assistant

## 2022-04-04 ENCOUNTER — Encounter: Payer: Self-pay | Admitting: Physician Assistant

## 2022-04-04 VITALS — BP 149/84 | HR 74 | Temp 97.8°F | Resp 12 | Ht 74.0 in | Wt 244.0 lb

## 2022-04-04 DIAGNOSIS — M2550 Pain in unspecified joint: Secondary | ICD-10-CM

## 2022-04-04 DIAGNOSIS — Z0000 Encounter for general adult medical examination without abnormal findings: Secondary | ICD-10-CM

## 2022-04-04 NOTE — Progress Notes (Signed)
 [facility] [place] [facility]   None    (approximate)  I have reviewed the triage vital signs and the nursing notes.   HISTORY  Chief Complaint Annual Exam   HPI Jose Moody is a 65 y.o. male patient presents to annual physical exam.  Patient voiced concern for multiple joint pain.         Past Medical History:  Diagnosis Date   Arthritis    GERD (gastroesophageal reflux disease)    Heart disease    Hyperlipidemia    Hypertension    Joint pain    Kidney stone     Patient Active Problem List   Diagnosis Date Noted   Generalized anxiety disorder 11/09/2021   Insomnia 11/09/2021   Umbilical hernia without obstruction and without gangrene 07/29/2014   DJD (degenerative joint disease) of thoracic spine 07/26/2009    Past Surgical History:  Procedure Laterality Date   COLONOSCOPY  2013-2014   HERNIA REPAIR  08/14/14    umbilical hernia   UPPER GI ENDOSCOPY  04/25/1996    Prior to Admission medications   Medication Sig Start Date End Date Taking? Authorizing Provider  allopurinol (ZYLOPRIM) 100 MG tablet Take 1 tablet (100 mg total) by mouth daily. 09/30/21  Yes Lee, Laurie W, PA-C  ALPRAZolam (XANAX) 0.25 MG tablet Take 1 tablet (0.25 mg total) by mouth 2 (two) times daily as needed for anxiety. 02/09/22  Yes Smith, Ronald K, PA-C  atorvastatin (LIPITOR) 80 MG tablet Take 1 tablet (80 mg total) by mouth daily. 09/30/21  Yes Lee, Laurie W, PA-C  Cholecalciferol (VITAMIN D3 ADULT GUMMIES PO) Take 1 each by mouth daily.   Yes [provider]  diclofenac Sodium (VOLTAREN ARTHRITIS PAIN) 1 % GEL Apply 2 Applications topically 4 (four) times daily.   Yes [provider]  gabapentin (NEURONTIN) 300 MG capsule Take 1 capsule (300 mg total) by mouth 3 (three) times daily. 02/09/22  Yes Smith, Ronald K, PA-C  indomethacin (INDOCIN) 50 MG capsule  12/11/20  Yes [provider]  Lecithin 1000 MG CHEW lecithin   Yes [provider]   lisinopril-hydrochlorothiazide (ZESTORETIC) 20-25 MG tablet Take 1 tablet by mouth daily. 08/19/21 08/14/22 Yes Smith, Ronald K, PA-C  Multiple Vitamins-Minerals (CENTRUM SILVER ADULT 50+ PO) Centrum Adult 50 Fresh-Fruity   Yes [provider]  omeprazole (PRILOSEC) 20 MG capsule Take 1 capsule (20 mg total) by mouth daily. 06/09/21  Yes Smith, Ronald K, PA-C  terbinafine (LAMISIL) 250 MG tablet Take 1 tablet (250 mg total) by mouth daily. 12/08/21  Yes Smith, Ronald K, PA-C  zolpidem (AMBIEN) 10 MG tablet Take 1 tablet (10 mg total) by mouth at bedtime as needed for sleep. 02/02/22 08/01/22 Yes Smith, Ronald K, PA-C    Allergies Patient has no known allergies.  Family History  Problem Relation Age of Onset   Diabetes Mother    Heart disease Father     Social History Social History   Tobacco Use   Smoking status: Never   Smokeless tobacco: Never  Substance Use Topics   Alcohol use: Yes   Drug use: No    Review of Systems Constitutional: No fever/chills Eyes: No visual changes. ENT: No sore throat. Cardiovascular: Denies chest pain. Respiratory: Denies shortness of breath. Gastrointestinal: No abdominal pain.  No nausea, no vomiting.  No diarrhea.  No constipation. Genitourinary: Negative for dysuria. Musculoskeletal: Negative for back pain. Skin: Negative for rash. Neurological: Negative for headaches, focal weakness or numbness.   Psychiatric: Anxiety and insomnia Endocrine: Mixed hyperlipidemia and hypertension   ____________________________________________   PHYSICAL EXAM:  VITAL SIGNS: BP is 149/84, pulse 74, respiration 12, temperature 97.8, patient 90% O2 sat on room air.  Patient weighs 244 pounds and BMI is 31.33.  Patient ambulates with the support of a hinged brace to the left lower extremity. Constitutional: Alert and oriented. Well appearing and in no acute distress. Eyes: Conjunctivae are normal. PERRL. EOMI. Head: Atraumatic. Nose: No  congestion/rhinnorhea. Mouth/Throat: Mucous membranes are moist.  Oropharynx non-erythematous. Neck: No stridor.  No cervical spine tenderness to palpation. Hematological/Lymphatic/Immunilogical: No cervical lymphadenopathy. Cardiovascular: Normal rate, regular rhythm. Grossly normal heart sounds.  Good peripheral circulation. Respiratory: Normal respiratory effort.  No retractions. Lungs CTAB. Gastrointestinal: Soft and nontender. No distention. No abdominal bruits. No CVA tenderness. Genitourinary: Deferred Musculoskeletal: No lower extremity tenderness nor edema.  No joint effusions. Neurologic:  Normal speech and language. No gross focal neurologic deficits are appreciated. No gait instability. Skin:  Skin is warm, dry and intact. No rash noted. Psychiatric: Mood and affect are normal. Speech and behavior are normal.  ____________________________________________   LABS ___         Component Ref Range & Units 3 d ago (04/01/22) 12 mo ago (04/06/21) 2 yr ago (03/17/20) 2 yr ago (04/09/19) 5 yr ago (08/29/16)  Color, UA  Yellow yellow yellow Light Yellow   Clarity, UA  Clear clear cloudy Clear   Glucose, UA Negative Negative Negative Negative Negative   Bilirubin, UA  Negative negative negative Negative   Ketones, UA  Negative negative negative Negative   Spec Grav, UA 1.010 - 1.025 1.015 1.015 1.020 1.010   Blood, UA  Negative negative negatve Negative   pH, UA 5.0 - 8.0 7.0 6.5 6.0 6.5   Protein, UA Negative Negative Positive Abnormal  CM Negative Negative   Urobilinogen, UA 0.2 or 1.0 E.U./dL 0.2 0.2 0.2 0.2   Nitrite, UA  Negative negative negative Negative   Leukocytes, UA Negative Negative Negative Negative Negative NEGATIVE R  Appearance   medium light  HAZY Abnormal  R  Odor        Resulting Agency      [facility]         Specimen Collected: 04/01/22 09:08 Last Resulted: 04/01/22 09:08      Lab Flowsheet      Order Details      View Encounter      Lab and  Collection Details      Routing      Result History    View All Conversations on this Encounter      CM=Additional comments  R=Reference range differs from displayed range      Result Care Coordination   Patient Communication   Add Comments   Seen Back to Top      Other Results from 04/01/2022   Contains abnormal data Male Executive Panel Order: [id number] Status: Final result      Visible to patient: Yes (seen)      Next appt: None      Dx: Annual physical exam    0 Result Notes              Component Ref Range & Units 3 d ago (04/01/22) 5 mo ago (10/29/21) 8 mo ago (07/14/21) 12 mo ago (04/06/21) 1 yr ago (08/06/20) 1 yr ago (06/18/20) 2 yr ago (03/17/20)  Glucose 70 - 99 mg/dL 110 High   127 High  103 High    102 High  R  111 High  R  Uric Acid 3.8 - 8.4 mg/dL 6.9  6.5 CM 6.8 CM 8.2 CM  6.6 CM  Comment:            Therapeutic target for gout patients: <6.0  BUN 8 - 27 mg/dL _0 Creatinine, Ser 0.76 - 1.27 mg/dL 1.11  1.00 0.96 1.02  0.97  eGFR >59 mL/min/1.73 75  85 89 83    BUN/Creatinine Ratio 10 - _1 High  12  12  Sodium 134 - 144 mmol/L 135  137 138 139  139  Potassium 3.5 - 5.2 mmol/L 3.9  4.1 4.1 4.2  3.9  Chloride 96 - 106 mmol/L 97  100 102 102  101  Calcium 8.6 - 10.2 mg/dL 9.7  9.8 9.4 9.9  9.3  Phosphorus 2.8 - 4.1 mg/dL 2.7 Low   3.0 2.6 Low  2.2 Low   2.6 Low   Total Protein 6.0 - 8.5 g/dL 7.1  6.9 7.1 7.1  7.1  Albumin 3.9 - 4.9 g/dL 4.8  4.7 R 4.7 R 4.8 R  4.8 R  Globulin, Total 1.5 - 4.5 g/dL 2.3  2.2 2.4 2.3  2.3  Albumin/Globulin Ratio 1.2 - 2.2 2.1  2.1 2.0 2.1  2.1  Bilirubin Total 0.0 - 1.2 mg/dL 0.7  0.7 0.7 0.7  0.6  Alkaline Phosphatase 44 - 121 IU/L 73  82 65 76  63 CM  LDH 121 - 224 IU/L 198  200 198 182  193  AST 0 - 40 IU/L _2 32  ALT 0 - 44 IU/L 44  37 39 28  43  GGT 0 - 65 IU/L 43  44 36 25  34  Iron 38 - 169 ug/dL 133  111 98 136  95  Cholesterol, Total 100 - 199 mg/dL 128 112 154 144 111 174  124  Triglycerides 0 - 149 mg/dL 217 High  210 High  331 High  202 High  182 High  299 High  253 High   HDL >39 mg/dL 35 Low  34 Low  35 Low  37 Low  33 Low  36 Low  34 Low   VLDL Cholesterol Cal 5 - 40 mg/dL 35 34 52 High  34 30 50 High  40  LDL Chol Calc (NIH) 0 - 99 mg/dL 58 44 67 73 48 88 50  Chol/HDL Ratio 0.0 - 5.0 ratio 3.7 3.3 CM 4.4 CM 3.9 CM 3.4 CM 4.8 CM 3.6 CM  Comment:                                   T. Chol/HDL Ratio                                             Men  Women                               1/2 Avg.Risk  3.4    3.3  Avg.Risk  5.0    4.4                                2X Avg.Risk  9.6    7.1                                3X Avg.Risk 23.4   11.0  Estimated CHD Risk 0.0 - 1.0 times avg. 0.6  0.9 CM 0.7 CM 0.5 CM  0.6 CM  Comment: The CHD Risk is based on the T. Chol/HDL ratio. Other factors affect CHD Risk such as hypertension, smoking, diabetes, severe obesity, and family history of premature CHD.  TSH 0.450 - 4.500 uIU/mL 1.920  2.460 1.880 1.910  2.300  T4, Total 4.5 - 12.0 ug/dL 5.8  7.4 6.2 6.7  5.9  T3 Uptake Ratio 24 - 39 % 30  32 _0 Free Thyroxine Index 1.2 - 4.9 1.7  2.4 1.8 1.9  1.7  Prostate Specific Ag, Serum 0.0 - 4.0 ng/mL 1.2  1.4 CM 1.3 CM   1.6 CM  Comment: Roche ECLIA methodology. According to the American Urological Association, Serum PSA should decrease and remain at undetectable levels after radical prostatectomy. The AUA defines biochemical recurrence as an initial PSA value 0.2 ng/mL or greater followed by a subsequent confirmatory PSA value 0.2 ng/mL or greater. Values obtained with different assay methods or kits cannot be used interchangeably. Results cannot be interpreted as absolute evidence of the presence or absence of malignant disease.  WBC 3.4 - 10.8 x10E3/uL 5.1  5.0 4.1 4.3  5.3  RBC 4.14 - 5.80 x10E6/uL 5.41  5.71 5.32 5.70  5.40  Hemoglobin 13.0 - 17.7 g/dL 17.7  18.3 High  16.9 17.8  High   17.8 High   Hematocrit 37.5 - 51.0 % 50.1  53.6 High  49.5 53.4 High   51.5 High   MCV 79 - 97 fL 93  94 93 94  95  MCH 26.6 - 33.0 pg 32.7  32.0 31.8 31.2  33.0  MCHC 31.5 - 35.7 g/dL 35.3  34.1 34.1 33.3  34.6  RDW 11.6 - 15.4 % 12.9  12.8 13.0 13.1  12.5  Platelets 150 - 450 x10E3/uL 130 Low   143 Low  120 Low  131 Low   133 Low   Neutrophils Not Estab. % 67  64 57 71  63  Lymphs Not Estab. % _1 Monocytes Not Estab. % _2 Eos Not Estab. % _3 Basos Not Estab. % 0  0 0 0  0  Neutrophils Absolute 1.4 - 7.0 x10E3/uL 3.4  3.2 2.3 3.0  3.3  Lymphocytes Absolute 0.7 - 3.1 x10E3/uL 1.1  1.3 1.3 0.9  1.3  Monocytes Absolute 0.1 - 0.9 x10E3/uL 0.5  0.5 0.4 0.3  0.5  EOS (ABSOLUTE) 0.0 - 0.4 x10E3/uL 0.1  0.1 0.1 0.1  0.1  Basophils Absolute 0.0 - 0.2 x10E3/uL 0.0  0.0 0.0 0.0  0.0  Immature Granulocytes Not Estab. % 0  0 0 0  0                _________________________________________  EKG  Normal sinus rhythm at 63 bpm ____________________________________________    ____________________________________________   INITIAL IMPRESSION /  ASSESSMENT AND PLAN / ED COURSE  As part of my medical decision making, I reviewed the following data within the electronic medical record:       Discussed lab and EKG results with patient.  Patient is scheduled for 3-month fasting lipid.  Patient also had labs drawn for arthralgia.  Patient follow-up for lab results.        ____________________________________________   FINAL CLINICAL IMPRESSION Well exam   ED Discharge Orders          Ordered    ANA,IFA RA Diag Pnl w/rflx Tit/Patn        04/04/22 0944    Sedimentation rate        04/04/22 0944             Note:  This document was prepared using Dragon voice recognition software and [place] include unintentional dictation errors.  

## 2022-04-04 NOTE — Addendum Note (Signed)
Addended by: Malachy Moan F on: 04/04/2022 09:57 AM   Modules accepted: Orders

## 2022-04-05 LAB — ANA W/REFLEX IF POSITIVE
Anti JO-1: <0.2 AI (ref 0.0–0.9)
Anti Nuclear Antibody (ANA): POSITIVE — AB
Centromere Ab Screen: <0.2 AI (ref 0.0–0.9)
Chromatin Ab SerPl-aCnc: <0.2 AI (ref 0.0–0.9)
ENA RNP Ab: 0.2 AI (ref 0.0–0.9)
ENA SM Ab Ser-aCnc: <0.2 AI (ref 0.0–0.9)
ENA SSA (RO) Ab: 0.2 AI (ref 0.0–0.9)
ENA SSB (LA) Ab: <0.2 AI (ref 0.0–0.9)
Scleroderma (Scl-70) (ENA) Antibody, IgG: 6.9 AI — ABNORMAL HIGH (ref 0.0–0.9)
dsDNA Ab: 1 IU/mL (ref 0–9)

## 2022-04-05 LAB — SEDIMENTATION RATE: Sed Rate: 7 mm/hr (ref 0–30)

## 2022-04-05 NOTE — Addendum Note (Signed)
Addended by: Aliene Altes on: 04/05/2022 01:33 PM   Modules accepted: Orders

## 2022-05-02 ENCOUNTER — Telehealth: Payer: Self-pay

## 2022-05-02 DIAGNOSIS — M1A09X0 Idiopathic chronic gout, multiple sites, without tophus (tophi): Secondary | ICD-10-CM | POA: Diagnosis not present

## 2022-05-02 DIAGNOSIS — M549 Dorsalgia, unspecified: Secondary | ICD-10-CM | POA: Diagnosis not present

## 2022-05-02 DIAGNOSIS — M40204 Unspecified kyphosis, thoracic region: Secondary | ICD-10-CM | POA: Diagnosis not present

## 2022-05-02 DIAGNOSIS — M159 Polyosteoarthritis, unspecified: Secondary | ICD-10-CM | POA: Diagnosis not present

## 2022-05-02 DIAGNOSIS — M545 Low back pain, unspecified: Secondary | ICD-10-CM | POA: Diagnosis not present

## 2022-05-02 DIAGNOSIS — M5134 Other intervertebral disc degeneration, thoracic region: Secondary | ICD-10-CM | POA: Diagnosis not present

## 2022-05-02 DIAGNOSIS — M5136 Other intervertebral disc degeneration, lumbar region: Secondary | ICD-10-CM | POA: Diagnosis not present

## 2022-05-02 DIAGNOSIS — R768 Other specified abnormal immunological findings in serum: Secondary | ICD-10-CM | POA: Diagnosis not present

## 2022-05-03 NOTE — Telephone Encounter (Signed)
JB

## 2022-05-13 DIAGNOSIS — K219 Gastro-esophageal reflux disease without esophagitis: Secondary | ICD-10-CM | POA: Insufficient documentation

## 2022-05-13 DIAGNOSIS — I10 Essential (primary) hypertension: Secondary | ICD-10-CM | POA: Diagnosis not present

## 2022-05-13 DIAGNOSIS — M159 Polyosteoarthritis, unspecified: Secondary | ICD-10-CM | POA: Diagnosis not present

## 2022-05-13 DIAGNOSIS — M25562 Pain in left knee: Secondary | ICD-10-CM | POA: Diagnosis not present

## 2022-05-13 DIAGNOSIS — F419 Anxiety disorder, unspecified: Secondary | ICD-10-CM | POA: Insufficient documentation

## 2022-05-13 DIAGNOSIS — G629 Polyneuropathy, unspecified: Secondary | ICD-10-CM | POA: Diagnosis not present

## 2022-05-13 DIAGNOSIS — R69 Illness, unspecified: Secondary | ICD-10-CM | POA: Diagnosis not present

## 2022-05-16 DIAGNOSIS — Z1211 Encounter for screening for malignant neoplasm of colon: Secondary | ICD-10-CM | POA: Diagnosis not present

## 2022-05-16 DIAGNOSIS — K635 Polyp of colon: Secondary | ICD-10-CM | POA: Diagnosis not present

## 2022-05-16 DIAGNOSIS — K640 First degree hemorrhoids: Secondary | ICD-10-CM | POA: Diagnosis not present

## 2022-05-16 DIAGNOSIS — D124 Benign neoplasm of descending colon: Secondary | ICD-10-CM | POA: Diagnosis not present

## 2022-06-01 ENCOUNTER — Other Ambulatory Visit: Payer: Self-pay | Admitting: Physical Medicine & Rehabilitation

## 2022-06-01 DIAGNOSIS — G8929 Other chronic pain: Secondary | ICD-10-CM

## 2022-06-08 ENCOUNTER — Ambulatory Visit
Admission: RE | Admit: 2022-06-08 | Discharge: 2022-06-08 | Disposition: A | Discharge status: Home / Self Care | Payer: 59 | Source: Ambulatory Visit | Attending: Physical Medicine & Rehabilitation | Admitting: Physical Medicine & Rehabilitation

## 2022-06-08 DIAGNOSIS — G8929 Other chronic pain: Secondary | ICD-10-CM

## 2022-06-08 DIAGNOSIS — M5442 Lumbago with sciatica, left side: Secondary | ICD-10-CM | POA: Insufficient documentation

## 2022-06-08 DIAGNOSIS — M5441 Lumbago with sciatica, right side: Secondary | ICD-10-CM | POA: Diagnosis present

## 2022-06-27 ENCOUNTER — Other Ambulatory Visit: Payer: Self-pay | Admitting: Physician Assistant

## 2022-06-27 DIAGNOSIS — F5101 Primary insomnia: Secondary | ICD-10-CM

## 2022-07-05 ENCOUNTER — Ambulatory Visit: Payer: Self-pay | Admitting: Physician Assistant

## 2022-07-05 ENCOUNTER — Encounter: Payer: Self-pay | Admitting: Physician Assistant

## 2022-07-05 VITALS — BP 154/86 | HR 72 | Temp 98.6°F | Resp 16

## 2022-07-05 DIAGNOSIS — R0981 Nasal congestion: Secondary | ICD-10-CM

## 2022-07-05 LAB — POC COVID19 BINAXNOW: SARS Coronavirus 2 Ag: NEGATIVE

## 2022-07-05 LAB — POCT INFLUENZA A/B
Influenza A, POC: NEGATIVE
Influenza B, POC: NEGATIVE

## 2022-07-05 MED ORDER — METHYLPREDNISOLONE 4 MG PO TBPK: ORAL_TABLET | ORAL | 0 refills | Status: DC

## 2022-07-05 MED ORDER — FEXOFENADINE-PSEUDOEPHED ER 60-120 MG PO TB12: 1.0000 | ORAL_TABLET | Freq: Two times a day (BID) | ORAL | 0 refills | Status: DC

## 2022-07-05 MED ORDER — AMOXICILLIN 500 MG PO CAPS: 500.0000 mg | ORAL_CAPSULE | Freq: Three times a day (TID) | ORAL | 0 refills | Status: DC

## 2022-07-05 NOTE — Progress Notes (Signed)
   Subjective: Sinus congestion    Patient ID: Jose Moody, male    DOB: 1956-01-27, 67 y.o.   MRN: [id number]  HPI Patient complains of 1 week of sinus congestion is worsened over the weekend.  Denies fever associated complaint.  Denies cough.  Denies recent travel or known contact with COVID-19.  Patient tested negative for COVID-19 and influenza today.   Review of Systems Anxiety and insomnia    Objective:   Physical Exam  BP is 154/86, pulse 72, respirations 16, temperature 98.6, patient 97% O2 sat on room air. HEENT is remarkable edematous nasal turbinate with bilateral maxillary guarding. Neck is supple for lymphadenopathy or bruits. Lungs are clear to auscultation. Heart regular rate and rhythm.      Assessment & Plan: Subacute maxillary sinusitis  Patient given a prescription for amoxicillin, Medrol Dosepak, and Allegra-D.  Patient advised follow-up 1 week if no improvement or worsening complaints.

## 2022-07-05 NOTE — Progress Notes (Signed)
Stated day 5 of congestion, runny nose with all URI s/s and been covid testing at home all negative.  Tested negative for flu and covid at clinic prior to visit today by staff.  Afebrile and taking fluids well.  Stated taking Coricidin with HTN and not effective.

## 2022-07-14 ENCOUNTER — Other Ambulatory Visit: Payer: Self-pay

## 2022-07-14 DIAGNOSIS — B351 Tinea unguium: Secondary | ICD-10-CM

## 2022-07-19 ENCOUNTER — Other Ambulatory Visit: Payer: Self-pay

## 2022-07-19 ENCOUNTER — Ambulatory Visit: Payer: Self-pay | Admitting: Physician Assistant

## 2022-07-19 ENCOUNTER — Encounter: Payer: Self-pay | Admitting: Physician Assistant

## 2022-07-19 DIAGNOSIS — B351 Tinea unguium: Secondary | ICD-10-CM

## 2022-07-19 DIAGNOSIS — E782 Mixed hyperlipidemia: Secondary | ICD-10-CM

## 2022-07-19 MED ORDER — TERBINAFINE HCL 250 MG PO TABS: 250.0000 mg | ORAL_TABLET | Freq: Every day | ORAL | 1 refills | Status: DC

## 2022-07-19 NOTE — Addendum Note (Signed)
Addended by: Hazle Coca on: 07/19/2022 08:18 AM   Modules accepted: Orders

## 2022-07-19 NOTE — Progress Notes (Signed)
   Subjective: Medication refill    Patient ID: Jose Moody, male    DOB: 1955-07-24, 67 y.o.   MRN: [id number]  HPI Patient requests refill on Lamisil tablets for toenail fungal infection.  Patient also requests reevaluation of elevated triglycerides.   Review of Systems Anxiety and insomnia    Objective:   Physical Exam  See nurses notes for vital signs. Toenail shows much improvement from last exam.      Assessment & Plan:onychromycosis   Patient prescription for Lamisil renewed.  Patient will follow-up status post lipid profile and LFTs.

## 2022-07-19 NOTE — Progress Notes (Signed)
Pt requesting refill on terbnafine.

## 2022-07-19 NOTE — Progress Notes (Addendum)
Pt presents today for lipid panel & LFT per Randel Pigg, PA-C

## 2022-07-20 LAB — HEPATIC FUNCTION PANEL
ALT: 42 IU/L (ref 0–44)
AST: 25 IU/L (ref 0–40)
Albumin: 4.2 g/dL (ref 3.9–4.9)
Alkaline Phosphatase: 78 IU/L (ref 44–121)
Bilirubin Total: 0.5 mg/dL (ref 0.0–1.2)
Bilirubin, Direct: 0.18 mg/dL (ref 0.00–0.40)
Total Protein: 6.2 g/dL (ref 6.0–8.5)

## 2022-07-20 LAB — LIPID PANEL
Chol/HDL Ratio: 3.6 ratio (ref 0.0–5.0)
Cholesterol, Total: 103 mg/dL (ref 100–199)
HDL: 29 mg/dL — ABNORMAL LOW (ref >39)
LDL Chol Calc (NIH): 40 mg/dL (ref 0–99)
Triglycerides: 209 mg/dL — ABNORMAL HIGH (ref 0–149)
VLDL Cholesterol Cal: 34 mg/dL (ref 5–40)

## 2022-07-25 IMAGING — CR DG KNEE COMPLETE 4+V*L*
1 series · 5 of 5 positions shown · non-contrast
Comparison: None.

CLINICAL DATA: Acute pain of left knee. Knee pain and swelling.
Patient reports pain for 8 weeks. History of gout.

EXAM:
LEFT KNEE - COMPLETE 4+ VIEW

[Series 1: dg knee complete 4 views left · 0.14mm/px · 5 of 5 slices shown]
[im 1/5]
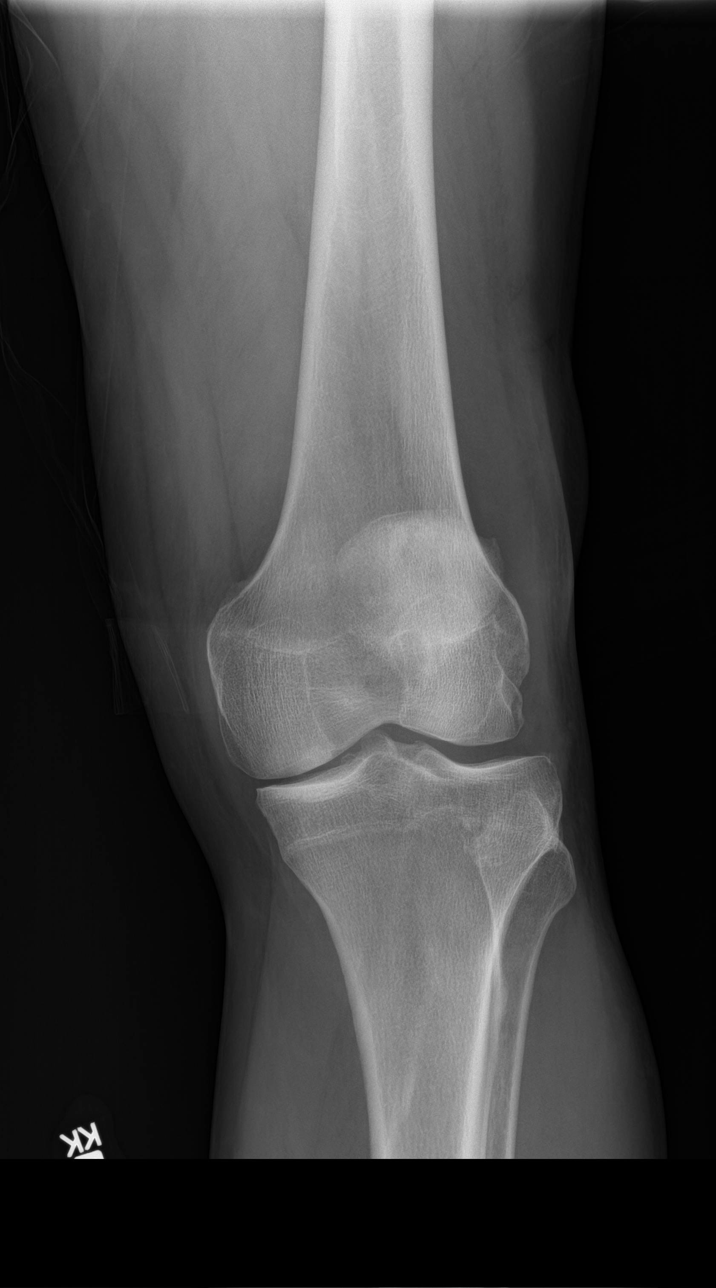
[im 2/5]
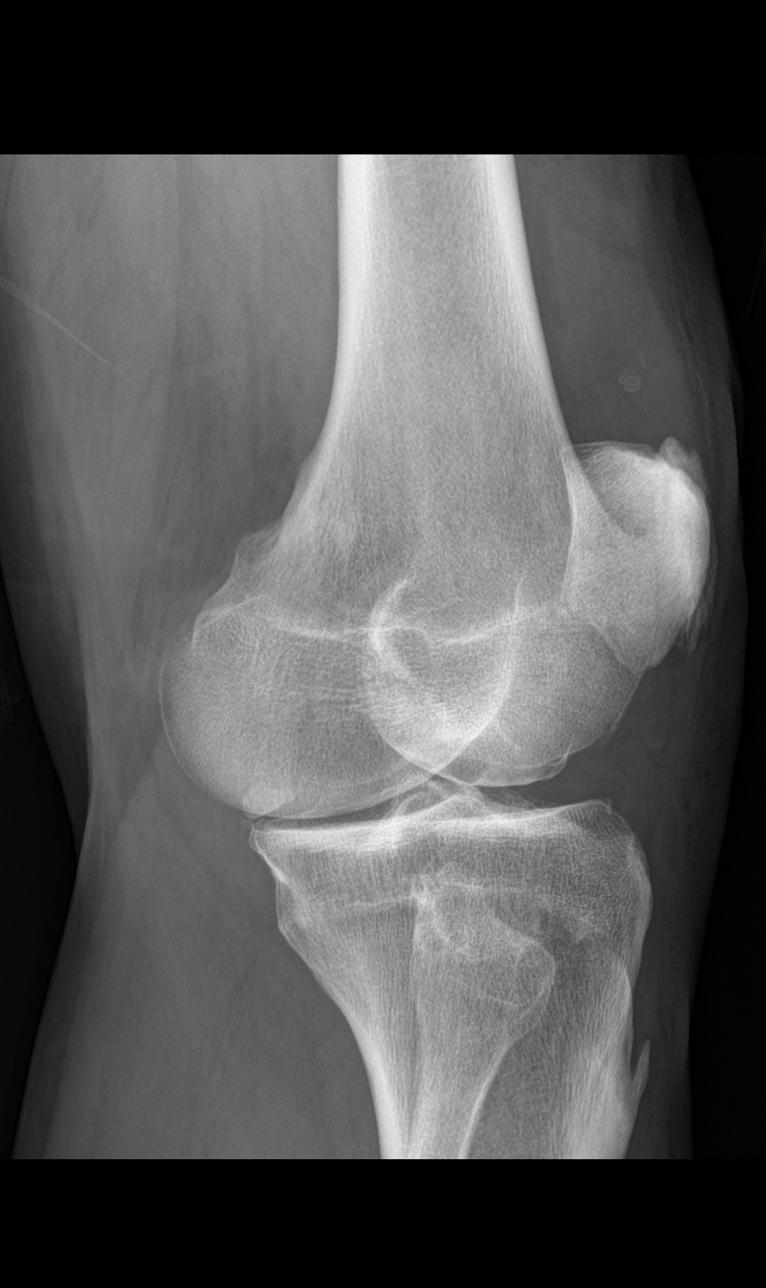
[im 3/5]
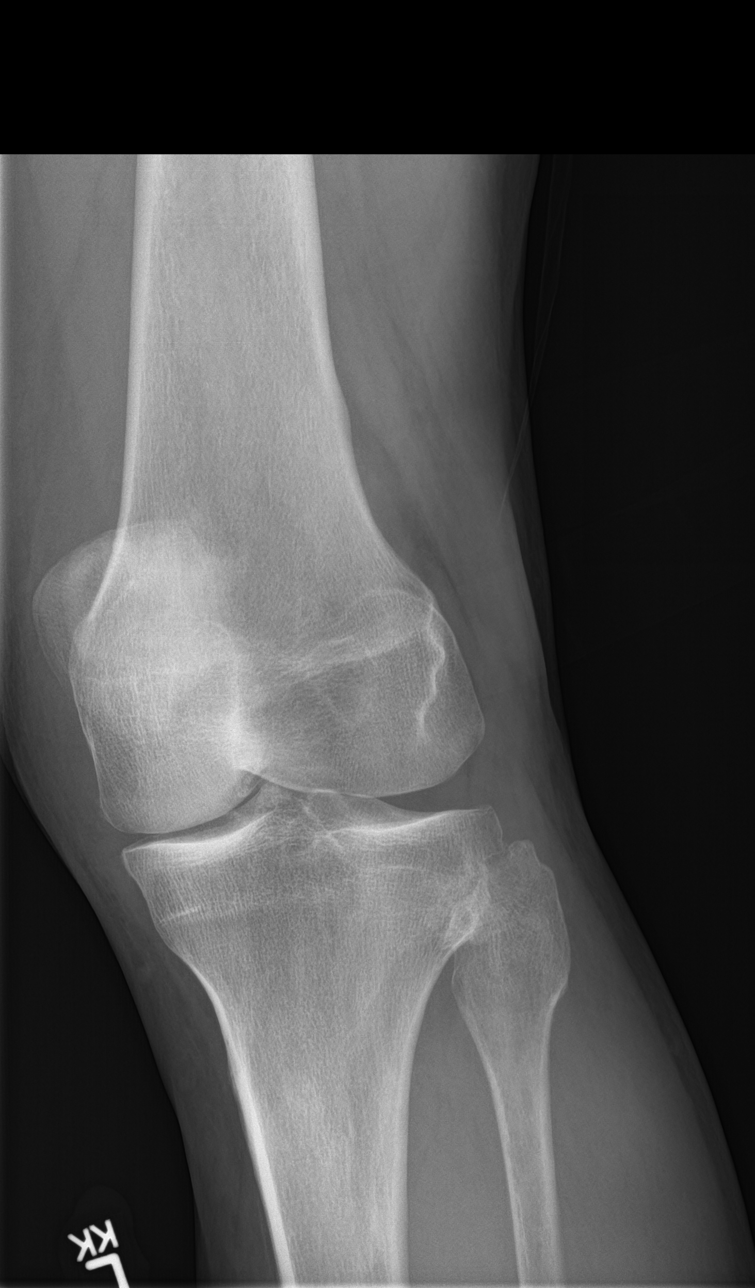
[im 4/5]
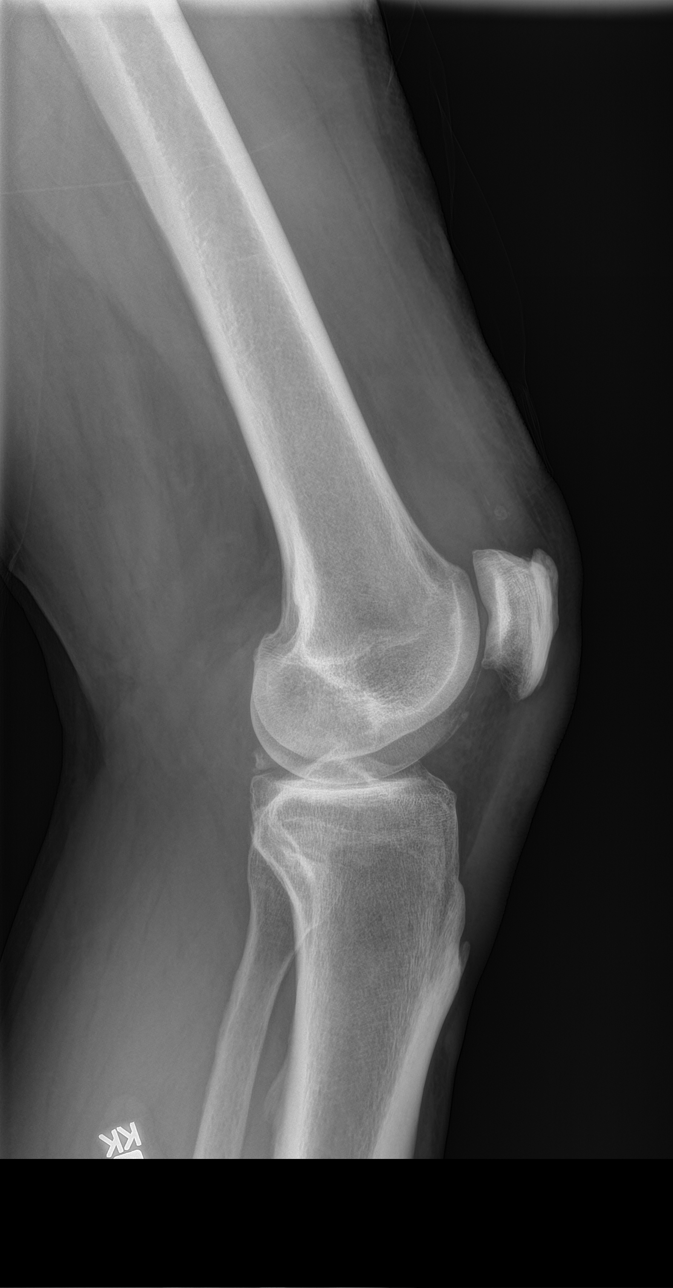
[im 5/5]
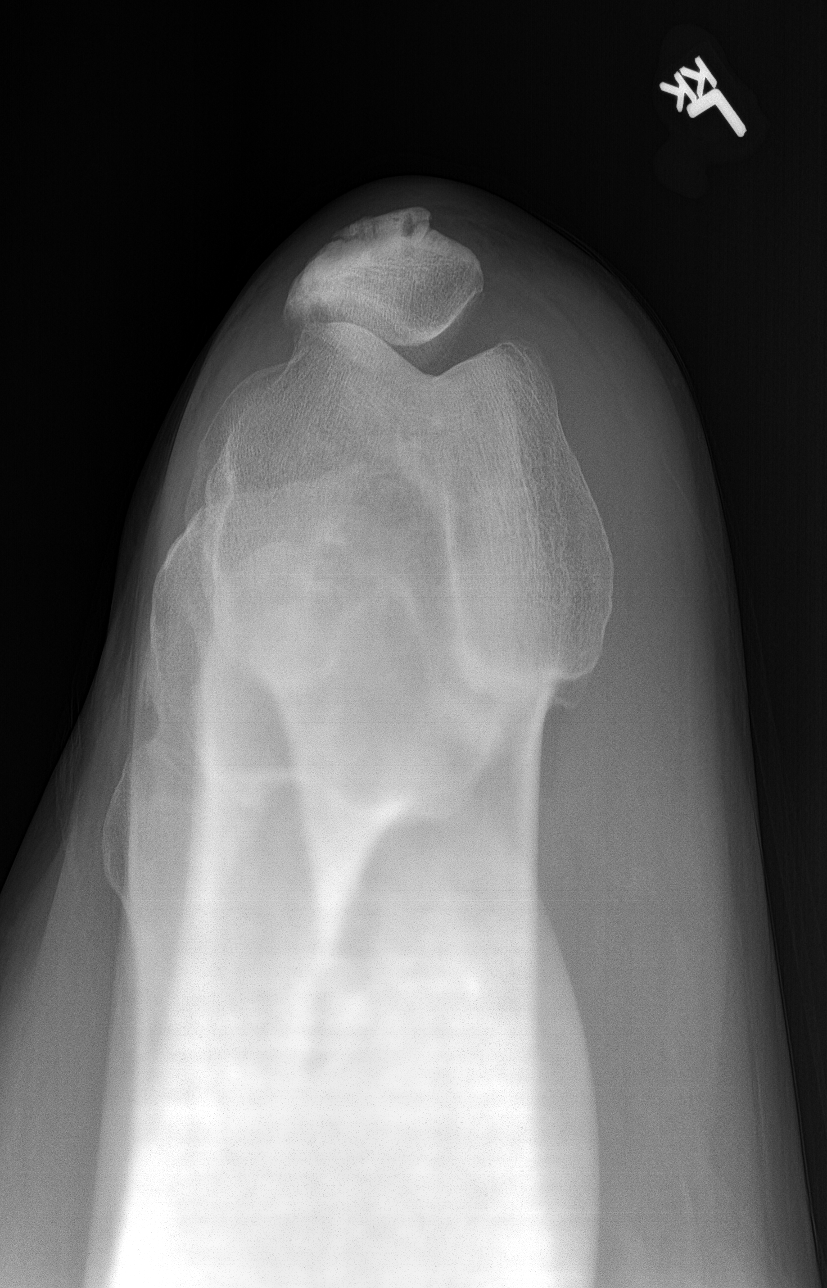

[5 of 5 positions shown; findings below may reference images not displayed]

FINDINGS: Standing AP, lateral, bilateral oblique as well as patellar sunrise
views obtained. There is mild medial tibiofemoral joint space
narrowing. Lateral patellar tilt. Peripheral spurring of the medial
tibiofemoral and patellofemoral compartment. Quadriceps tendon
enthesophyte. Small to moderate knee joint effusion. Mild edema in
Hoffa's fat pad. No erosion, bony destruction or focal bone lesion.
IMPRESSION: 1. Mild osteoarthritis involving the medial tibiofemoral and
patellofemoral compartments. Mild lateral patellar tilt.
2. Small to moderate knee joint effusion.

## 2022-08-01 ENCOUNTER — Other Ambulatory Visit: Payer: Self-pay

## 2022-08-01 ENCOUNTER — Other Ambulatory Visit: Payer: Self-pay | Admitting: Physician Assistant

## 2022-08-01 DIAGNOSIS — F5101 Primary insomnia: Secondary | ICD-10-CM

## 2022-08-01 MED ORDER — ZOLPIDEM TARTRATE 10 MG PO TABS: 10.0000 mg | ORAL_TABLET | Freq: Every evening | ORAL | 5 refills | Status: DC | PRN

## 2022-09-05 ENCOUNTER — Other Ambulatory Visit: Payer: Self-pay

## 2022-09-05 ENCOUNTER — Inpatient Hospital Stay
Admission: RE | Admit: 2022-09-05 | Discharge: 2022-09-05 | Disposition: A | Discharge status: Home / Self Care | Payer: Self-pay | Source: Ambulatory Visit | Attending: Orthopedic Surgery | Admitting: Orthopedic Surgery

## 2022-09-05 DIAGNOSIS — Z049 Encounter for examination and observation for unspecified reason: Secondary | ICD-10-CM

## 2022-09-15 NOTE — Progress Notes (Signed)
Referring Physician:  Patterson Hammersmith, MD [address] [place],  [place] [address]  Primary Physician:  Dorothey Baseman, MD  History of Present Illness: 09/16/2022 [name] has a history of HTN, GERD, and gout.   He has been seeing Dr. Mariah Milling for his lumbar spine and had bilateral L5-S1 TF ESI on 09/12/22. Has known severe central stenosis L2-L5 and mild/moderate foraminal stenosis L5-S1.   He also sees rheumatology for gout and they referred him here for his back and neck.   History of injury at work in 2010 ago and he started with neck pain at that time. He has constant neck pain. No arm pain, he has intermittent numbness and tingling that is worse at night. He notes that he dropping things with both hands. Feels like he Ouzts have some weakness. No balance issues.   Also with LBP that is improved with injection done last week. Since his injection, he has only minimal LBP (it was constant). He has no leg pain. No numbness, tingling, or weakness in his legs.   He has left knee pain and known severe OA- he needs TKA and they are waiting until he turns 67.   Bowel/Bladder Dysfunction: none  Conservative measures:  Physical therapy: No PT for back and neck.  Multimodal medical therapy including regular antiinflammatories: neurontin, indomethacin, voltaren gel, medrol dose pack Injections:  bilateral L5-S1 TF ESI on 09/12/22 bilateral L5-S1 TFESI 06/30/2022 with 90% improvement   Past Surgery: no cervical or lumbar surgery  Jose Moody has some symptoms of cervical myelopathy. Dropping things, but no balance issues.   The symptoms are causing a significant impact on the patient's life.   Review of Systems:  A 10 point review of systems is negative, except for the pertinent positives and negatives detailed in the HPI.  Past Medical History: Past Medical History:  Diagnosis Date   Arthritis    GERD (gastroesophageal reflux disease)    Heart disease    Hyperlipidemia     Hypertension    Joint pain    Kidney stone     Past Surgical History: Past Surgical History:  Procedure Laterality Date   COLONOSCOPY  2013-2014   HERNIA REPAIR  08/14/14    umbilical hernia   UPPER GI ENDOSCOPY  04/25/1996    Allergies: Allergies as of 09/16/2022   (No Known Allergies)    Medications: Outpatient Encounter Medications as of 09/16/2022  Medication Sig   allopurinol (ZYLOPRIM) 100 MG tablet Take 1 tablet (100 mg total) by mouth daily.   Alpha-Lipoic Acid 200 MG TABS Take by mouth.   ALPRAZolam (XANAX) 0.25 MG tablet Take 1 tablet (0.25 mg total) by mouth 2 (two) times daily as needed for anxiety.   amoxicillin (AMOXIL) 500 MG capsule Take 1 capsule (500 mg total) by mouth 3 (three) times daily.   atorvastatin (LIPITOR) 80 MG tablet Take 1 tablet (80 mg total) by mouth daily.   Cholecalciferol (VITAMIN D3 ADULT GUMMIES PO) Take 1 each by mouth daily.   diclofenac Sodium (VOLTAREN ARTHRITIS PAIN) 1 % GEL Apply 2 Applications topically 4 (four) times daily.   fexofenadine-pseudoephedrine (ALLEGRA-D) 60-120 MG 12 hr tablet Take 1 tablet by mouth 2 (two) times daily.   gabapentin (NEURONTIN) 300 MG capsule Take 1 capsule (300 mg total) by mouth 3 (three) times daily.   indomethacin (INDOCIN) 50 MG capsule    Lecithin 1000 MG CHEW lecithin   methylPREDNISolone (MEDROL DOSEPAK) 4 MG TBPK tablet Take Tapered  dose as directed   Multiple Vitamins-Minerals (CENTRUM SILVER ADULT 50+ PO) Centrum Adult 50 Fresh-Fruity   omeprazole (PRILOSEC) 20 MG capsule TAKE 1 CAPSULE BY MOUTH ONCE DAILY   terbinafine (LAMISIL) 250 MG tablet Take 1 tablet (250 mg total) by mouth daily.   terbinafine (LAMISIL) 250 MG tablet Take 1 tablet (250 mg total) by mouth daily.   zolpidem (AMBIEN) 10 MG tablet Take 1 tablet (10 mg total) by mouth at bedtime as needed for sleep.   lisinopril-hydrochlorothiazide (ZESTORETIC) 20-25 MG tablet Take 1 tablet by mouth daily.   No facility-administered  encounter medications on file as of 09/16/2022.    Social History: Social History   Tobacco Use   Smoking status: Never   Smokeless tobacco: Never  Substance Use Topics   Alcohol use: Yes   Drug use: No    Family Medical History: Family History  Problem Relation Age of Onset   Diabetes Mother    Heart disease Father     Physical Examination: Vitals:   09/16/22 1002 09/16/22 1040  BP: (!) 142/72 (!) 142/74  Pulse: 75   SpO2: 97%     General: Patient is well developed, well nourished, calm, collected, and in no apparent distress. Attention to examination is appropriate.  Respiratory: Patient is breathing without any difficulty.   NEUROLOGICAL:     Awake, alert, oriented to person, place, and time.  Speech is clear and fluent. Fund of knowledge is appropriate.   Cranial Nerves: Pupils equal round and reactive to light.  Facial tone is symmetric.    Limited ROM of cervical spine with pain No posterior cervical tenderness. Mild tenderness in bilateral trapezial region.   No posterior lumbar tenderness.   No abnormal lesions on exposed skin.   Strength: Side Biceps Triceps Deltoid Interossei Grip Wrist Ext. Wrist Flex.  R 5 5 5 5 5 5 5   L 5 5 5 5 5 5 5    Side Iliopsoas Quads Hamstring PF DF EHL  R 5 5 5 5 5 5   L 5 5 5 5 5 5    Reflexes are 2+ and symmetric at the biceps, triceps, brachioradialis, patella and achilles.   Hoffman's is absent.  Clonus is not present.   Bilateral upper and lower extremity sensation is intact to light touch.     Negative tinels at wrist and elbow bilaterally.  Positive phalens at wrist bilaterally.   Gait is normal.     Medical Decision Making  Imaging: Cervical xrays dated 09/02/22:  Mild/moderate cervical spondylosis and DDD C5-C7.   Lumbar xrays dated 05/02/22:  Diffuse lumbar DDD and spondylosis.   No report for above cervical/lumbar xrays.    Lumbar MRI dated 06/08/22:  FINDINGS: Segmentation:  Normal on the  comparison.   Alignment: Chronic straightening and mild reversal of upper lumbar lordosis appears stable since the 2018 CT. No significant scoliosis or spondylolisthesis.   Vertebrae: Bulky chronic L2-L3 endplate degeneration with mild associated patchy endplate marrow edema eccentric to the left (series 7, image 11). See additional details of that level below. Slightly more intense degenerative endplate marrow edema laterally on the right at L4-L5, also posteriorly at that level.   Normal background bone marrow signal. No other acute osseous abnormality identified. Intact visible sacrum and SI joints.   Conus medullaris and cauda equina: Conus extends to the L1 level. No lower spinal cord or conus signal abnormality.   Paraspinal and other soft tissues: Right lower pole renal cyst is new since 2018 but  appears simple, benign (no follow-up imaging recommended). Other visualized abdominal viscera and paraspinal soft tissues are within normal limits.   Disc levels:   T11-T12: Partially visible disc bulging. No definite spinal stenosis.   T12-L1: Negative disc. Mild facet and ligament flavum hypertrophy but no stenosis.   L1-L2: Disc desiccation and circumferential disc bulge. Mild epidural lipomatosis. Mild overall spinal stenosis (series 8, image 10).   L2-L3: Severe chronic disc space loss and bulky circumferential disc osteophyte complex. Mild ligament flavum and mild to moderate facet hypertrophy with trace degenerative facet joint fluid. Epidural lipomatosis. Moderate to severe spinal stenosis (series 8, image 17 and series 5, image 8). Left lateral recess appears more affected (left L3 nerve level). Moderate left and mild-to-moderate right L2 foraminal stenosis.   L3-L4: Circumferential disc bulge or disc osteophyte complex with up to moderate posterior element hypertrophy and epidural lipomatosis. Severe spinal stenosis (series 8, image 23) with symmetric  lateral recess involvement. Moderate to severe left and mild right L3 foraminal stenosis.   L4-L5: Bulky circumferential disc bulge with broad-based posterior component. Endplate spurring. Moderate facet and ligament flavum hypertrophy. Epidural lipomatosis. Severe spinal stenosis. Symmetric lateral recess involvement. Moderate bilateral L4 foraminal stenosis.   L5-S1: Mild mostly far lateral disc bulging but moderate to severe bilateral facet hypertrophy. Trace degenerative facet joint fluid. No significant spinal or lateral recess stenosis. Mild to moderate right L5 foraminal stenosis.   IMPRESSION: 1. Bulky lumbar disc and endplate degeneration, especially at L2-L3, with multifactorial severe spinal stenosis at L2-L3 through L4-L5. Up to severe left L3 and moderate bilateral L4 neural foraminal stenosis. 2. Moderate to severe facet arthropathy at L5-S1 with up to moderate right L5 neural foraminal stenosis. 3. Mild spinal stenosis at L1-L2 in large part due to epidural lipomatosis. 4. Mild degenerative endplate marrow edema at L2-L3, L4-L5.     Electronically Signed   By: Odessa Fleming M.D.   On: 06/09/2022 05:23    I have personally reviewed the images and agree with the above interpretation.  Assessment and Plan: Jose Moody is a pleasant 67 y.o. male has history of injury at work in 2010 and he started with neck pain at that time. He has constant neck pain with no arm pain. He has intermittent numbness and tingling that is worse at night. He notes that he dropping things with both hands. Feels like he Lowe have some weakness. No balance issues.   He has known mild/moderate cervical spondylosis and DDD C5-C7.   Also with LBP that is improved with injection done last week. Since his injection, he has only minimal LBP (it was constant). He has no leg pain. No numbness, tingling, or weakness in his legs.   He has known diffuse lumbar DDD/spondylosis with severe central stenosis L2s-L5  and mild/moderate foraminal stenosis L5-S1. LBP likely multifactorial. Does not appear to be symptomatic from stenosis.  Treatment options discussed with patient and following plan made:   - MRI of cervical spine to further evaluate cervical radiculopathy. He is dropping things and notes intermittent numbness/tingling.  - Continue current treatment for lumbar spine with Dr. Mariah Milling.  - Depending on results of cervical MRI, Jose Moody consider EMG of bilateral upper extremities. Had positive phalens at wrist bilaterally. [place] have carpal tunnel? - Will schedule phone visit to review MRI results once I get them back.   BP was slightly elevated. He will recheck at home and follow up with PCP if it remains elevated.   I spent a total  of 30 minutes in face-to-face and non-face-to-face activities related to this patient's care today including review of outside records, review of imaging, review of symptoms, physical exam, discussion of differential diagnosis, discussion of treatment options, and documentation.   Thank you for involving me in the care of this patient.   Drake Leach PA-C Dept. of Neurosurgery

## 2022-09-16 ENCOUNTER — Inpatient Hospital Stay
Admission: RE | Admit: 2022-09-16 | Discharge: 2022-09-16 | Disposition: A | Discharge status: Home / Self Care | Payer: Self-pay | Source: Ambulatory Visit | Attending: Orthopedic Surgery | Admitting: Orthopedic Surgery

## 2022-09-16 ENCOUNTER — Ambulatory Visit (INDEPENDENT_AMBULATORY_CARE_PROVIDER_SITE_OTHER): Payer: 59 | Admitting: Orthopedic Surgery

## 2022-09-16 ENCOUNTER — Encounter: Payer: Self-pay | Admitting: Orthopedic Surgery

## 2022-09-16 VITALS — BP 142/74 | HR 75 | Ht 72.0 in | Wt 245.2 lb

## 2022-09-16 DIAGNOSIS — M5136 Other intervertebral disc degeneration, lumbar region: Secondary | ICD-10-CM

## 2022-09-16 DIAGNOSIS — M47812 Spondylosis without myelopathy or radiculopathy, cervical region: Secondary | ICD-10-CM

## 2022-09-16 DIAGNOSIS — M5412 Radiculopathy, cervical region: Secondary | ICD-10-CM

## 2022-09-16 DIAGNOSIS — Z049 Encounter for examination and observation for unspecified reason: Secondary | ICD-10-CM

## 2022-09-16 DIAGNOSIS — M50322 Other cervical disc degeneration at C5-C6 level: Secondary | ICD-10-CM | POA: Diagnosis not present

## 2022-09-16 DIAGNOSIS — M47816 Spondylosis without myelopathy or radiculopathy, lumbar region: Secondary | ICD-10-CM | POA: Diagnosis not present

## 2022-09-16 DIAGNOSIS — M51369 Other intervertebral disc degeneration, lumbar region without mention of lumbar back pain or lower extremity pain: Secondary | ICD-10-CM

## 2022-09-16 DIAGNOSIS — M50323 Other cervical disc degeneration at C6-C7 level: Secondary | ICD-10-CM

## 2022-09-16 DIAGNOSIS — M48061 Spinal stenosis, lumbar region without neurogenic claudication: Secondary | ICD-10-CM

## 2022-09-16 DIAGNOSIS — M4722 Other spondylosis with radiculopathy, cervical region: Secondary | ICD-10-CM | POA: Diagnosis not present

## 2022-09-16 DIAGNOSIS — M5030 Other cervical disc degeneration, unspecified cervical region: Secondary | ICD-10-CM

## 2022-09-16 NOTE — Patient Instructions (Signed)
It was so nice to see you today. Thank you so much for coming in.    Your neck xrays show some wear and tear (arthritis). This is likely causing your neck pain and [place] be causing some of the arm symptoms.   I want to get an MRI of your neck to look into things further. We will get this approved through your insurance and [facility] will call you to schedule the appointment.   You have wear and tear in your back as well as spinal stenosis. I am glad you are feeling better after the injections.   Once I get the MRI results back, we will call to schedule a phone visit to review them.   Your blood pressure was slightly elevated. Recommend you recheck at home and follow up with PCP if it remains elevated.   Please do not hesitate to call if you have any questions or concerns. You can also message me in MyChart.   If you have not heard back about any of the MRI in the next week, please call the office so we can help you get it scheduled.   Drake Leach PA-C [phone]

## 2022-09-16 NOTE — Addendum Note (Signed)
Addended by: Sharlot Gowda on: 09/16/2022 10:42 AM   Modules accepted: Orders

## 2022-09-20 ENCOUNTER — Ambulatory Visit
Admission: RE | Admit: 2022-09-20 | Discharge: 2022-09-20 | Disposition: A | Discharge status: Home / Self Care | Payer: 59 | Source: Ambulatory Visit | Attending: Orthopedic Surgery | Admitting: Orthopedic Surgery

## 2022-09-20 DIAGNOSIS — M5412 Radiculopathy, cervical region: Secondary | ICD-10-CM | POA: Diagnosis present

## 2022-09-20 DIAGNOSIS — M47812 Spondylosis without myelopathy or radiculopathy, cervical region: Secondary | ICD-10-CM | POA: Diagnosis present

## 2022-09-20 DIAGNOSIS — M5030 Other cervical disc degeneration, unspecified cervical region: Secondary | ICD-10-CM | POA: Diagnosis present

## 2022-09-26 ENCOUNTER — Other Ambulatory Visit: Payer: Self-pay | Admitting: Physician Assistant

## 2022-09-26 DIAGNOSIS — I10 Essential (primary) hypertension: Secondary | ICD-10-CM

## 2022-09-28 ENCOUNTER — Other Ambulatory Visit: Payer: Self-pay | Admitting: Physical Medicine & Rehabilitation

## 2022-09-28 DIAGNOSIS — M5412 Radiculopathy, cervical region: Secondary | ICD-10-CM

## 2022-09-30 ENCOUNTER — Other Ambulatory Visit: Payer: Self-pay

## 2022-10-04 NOTE — Discharge Instructions (Signed)
Post Procedure Spinal Discharge Instruction Sheet  You Lasker resume a regular diet and any medications that you routinely take (including pain medications) unless otherwise noted by MD.  No driving day of procedure.  Light activity throughout the rest of the day.  Do not do any strenuous work, exercise, bending or lifting.  The day following the procedure, you can resume normal physical activity but you should refrain from exercising or physical therapy for at least three days thereafter.  You Isabell apply ice to the injection site, 20 minutes on, 20 minutes off, as needed. Do not apply ice directly to skin.    Common Side Effects:  Headaches- take your usual medications as directed by your physician.  Increase your fluid intake.  Caffeinated beverages Esteban be helpful.  Lie flat in bed until your headache resolves.  Restlessness or inability to sleep- you Sar have trouble sleeping for the next few days.  Ask your referring physician if you need any medication for sleep.  Facial flushing or redness- should subside within a few days.  Increased pain- a temporary increase in pain a day or two following your procedure is not unusual.  Take your pain medication as prescribed by your referring physician.  Leg cramps  Please contact our office at [phone] for the following symptoms: Fever greater than 100 degrees. Headaches unresolved with medication after 2-3 days. Increased swelling, pain, or redness at injection site.   Thank you for visiting [facility]!  

## 2022-10-04 NOTE — Progress Notes (Unsigned)
Telephone Visit- Progress Note: Referring Physician:  No referring provider defined for this encounter.  Primary Physician:  Dorothey Baseman, MD  This visit was performed via telephone.  Patient location: home Provider location: office  I spent a total of 10 minutes non-face-to-face activities for this visit on the date of this encounter including review of current clinical condition and response to treatment.    Patient has given verbal consent to this telephone visits and we reviewed the limitations of a telephone visit. Patient wishes to proceed.    Chief Complaint:  review cervical MRI  History of Present Illness: Jose Moody is a 67 y.o. male has a history of HTN, GERD, and gout.    He has been seeing Dr. Mariah Milling for his lumbar spine and had bilateral L5-S1 TF ESI on 09/12/22. Has known severe central stenosis L2-L5 and mild/moderate foraminal stenosis L5-S1.    He also sees rheumatology for gout and they referred him here for his back and neck.   Primary complaint continues to be constant neck pain. No arm pain, but he has intermittent numbness and tingling that is worse at night. He thinks he Luczynski have some weakness. No issues with hand dexterity. He has severe OA in knees so has some difficulty with ambulation. No gross balance issues.   He had good relief of LBP with last ESI. No leg pain.   He saw Dr. Mariah Milling on 5/1/245 and is scheduled for C7-T1 [place] ESI today at [facility].  Conservative measures:  Physical therapy: No PT for back and neck.  Multimodal medical therapy including regular antiinflammatories: neurontin, indomethacin, voltaren gel, medrol dose pack Injections:  bilateral L5-S1 TF ESI on 09/12/22 bilateral L5-S1 TFESI 06/30/2022 with 90% improvement    Past Surgery: no cervical or lumbar surgery    Exam: No exam done as this was a telephone encounter.     Imaging: Cervical MRI dated 09/20/22:  FINDINGS: Alignment: Straightening of cervical  lordosis not significantly changed from visible cervical spine on the 2011 MRI. No significant scoliosis or spondylolisthesis.   Vertebrae: Widespread vertebral endplate and facet spurring in the cervical spine. Normal background bone marrow signal. No convincing No marrow edema or evidence of acute osseous abnormality.   Cord: Multilevel degenerative cervical spinal cord mass effect (detailed below) with no cord signal abnormality identified.   Posterior Fossa, vertebral arteries, paraspinal tissues: Cervicomedullary junction is within normal limits. Negative visible posterior fossa. Major vascular flow voids in the neck are preserved. Left vertebral artery appears slightly dominant. Negative visible neck soft tissues.   Disc levels:   C2-C3: Mostly foraminal disc bulging and endplate spurring. Mild to moderate facet hypertrophy greater on the left. Mild ligament flavum hypertrophy. No spinal stenosis. Moderate to severe left and moderate right C3 foraminal stenosis.   C3-C4: Similar mostly foraminal disc bulging and endplate spurring eccentric to the left. Moderate facet hypertrophy greater on the left. Mild spinal stenosis with up to mild ventral cord mass effect (series 5, image 8). Severe bilateral C4 foraminal stenosis.   C4-C5: Circumferential disc osteophyte complex with a broad-based posterior component. Moderate facet and ligament flavum hypertrophy worse on the right. Mild spinal stenosis and spinal cord mass effect. Moderate left but severe right C5 foraminal stenosis.   C5-C6: Bulky circumferential disc osteophyte complex. Broad-based posterior component. Mild to moderate facet and moderate ligament flavum hypertrophy. Mild to moderate spinal stenosis and spinal cord mass effect (series 5, image 8). Severe bilateral C6 foraminal stenosis.  C6-C7: Circumferential disc osteophyte complex with broad-based posterior and foraminal involvement. More mild facet, mild  to moderate ligament flavum hypertrophy. Mild spinal stenosis. Up to mild cord mass effect. Moderate to severe left, severe right C7 foraminal stenosis.   C7-T1: Mild endplate spurring. Moderate facet hypertrophy. No spinal stenosis. Mild bilateral C8 foraminal stenosis.   No visible upper thoracic spinal stenosis, but there is upper thoracic facet arthropathy also.   IMPRESSION: 1. Widespread bulky cervical spine degeneration. No acute osseous abnormality.   2. Mild to moderate multifactorial spinal stenosis AND spinal cord mass effect at C5-C6. Mild spinal stenosis with up to mild cord mass effect C3-C4, C4-C5, and C6-C7. No associated spinal cord signal abnormality.   3. Associated severe neural foraminal stenosis at bilateral C4, right C5, bilateral C6, and right C7 nerve levels. With generally moderate cervical foraminal stenosis elsewhere.     Electronically Signed   By: Odessa Fleming M.D.   On: 09/25/2022 11:49  I have personally reviewed the images and agree with the above interpretation.  Assessment and Plan: [name] is a pleasant 67 y.o. male with history of injury at work in 2010 with constant neck pain. No arm pain, but he has intermittent numbness and tingling that is worse at night.   He has known diffuse cervical spondylosis with multilevel foraminal and central stenosis. Mild/moderate central stenosis at C5-C6.   Neck pain likely is multifactorial. Numbness and tingling are suspicious for carpal tunnel (positive phalens bilaterally at last visit, worse at night).   No signs of cervical myelopathy. No dexterity issues. Some issues with knees, but no gross balance issues.    He also has LBP that is improved with last ESI. No leg pain.    He has known diffuse lumbar DDD/spondylosis with severe central stenosis L2s-L5 and mild/moderate foraminal stenosis L5-S1. LBP likely multifactorial. Does not appear to be symptomatic from stenosis.   Treatment options discussed  with patient and following plan made:   - Agree with cervical ESI that is scheduled for today.  - Continue current treatment for lumbar spine with Dr. Mariah Milling.  - Order EMG of bilateral upper extremities. Had positive phalens at wrist bilaterally. [place] have carpal tunnel? He would like this done prior to 11/27/22.  - Will schedule f/u to review EMG and recheck after cervical ESI. He will let me know when EMG has been scheduled.   Drake Leach PA-C Neurosurgery

## 2022-10-05 ENCOUNTER — Ambulatory Visit
Admission: RE | Admit: 2022-10-05 | Discharge: 2022-10-05 | Disposition: A | Discharge status: Home / Self Care | Payer: 59 | Source: Ambulatory Visit | Attending: Physical Medicine & Rehabilitation | Admitting: Physical Medicine & Rehabilitation

## 2022-10-05 ENCOUNTER — Telehealth: Payer: Self-pay | Admitting: Orthopedic Surgery

## 2022-10-05 ENCOUNTER — Ambulatory Visit (INDEPENDENT_AMBULATORY_CARE_PROVIDER_SITE_OTHER): Payer: 59 | Admitting: Orthopedic Surgery

## 2022-10-05 ENCOUNTER — Encounter: Payer: Self-pay | Admitting: Orthopedic Surgery

## 2022-10-05 DIAGNOSIS — M47812 Spondylosis without myelopathy or radiculopathy, cervical region: Secondary | ICD-10-CM

## 2022-10-05 DIAGNOSIS — M5136 Other intervertebral disc degeneration, lumbar region: Secondary | ICD-10-CM | POA: Diagnosis not present

## 2022-10-05 DIAGNOSIS — M47816 Spondylosis without myelopathy or radiculopathy, lumbar region: Secondary | ICD-10-CM

## 2022-10-05 DIAGNOSIS — M5030 Other cervical disc degeneration, unspecified cervical region: Secondary | ICD-10-CM

## 2022-10-05 DIAGNOSIS — R200 Anesthesia of skin: Secondary | ICD-10-CM

## 2022-10-05 DIAGNOSIS — M5412 Radiculopathy, cervical region: Secondary | ICD-10-CM

## 2022-10-05 DIAGNOSIS — M4802 Spinal stenosis, cervical region: Secondary | ICD-10-CM | POA: Diagnosis not present

## 2022-10-05 DIAGNOSIS — R202 Paresthesia of skin: Secondary | ICD-10-CM

## 2022-10-05 MED ORDER — IOPAMIDOL (ISOVUE-M 300) INJECTION 61%
1.0000 mL | Freq: Once | INTRAMUSCULAR | Status: AC
  Administered 2022-10-05: 1 mL via EPIDURAL

## 2022-10-05 MED ORDER — TRIAMCINOLONE ACETONIDE 40 MG/ML IJ SUSP (RADIOLOGY)
60.0000 mg | Freq: Once | INTRAMUSCULAR | Status: AC
  Administered 2022-10-05: 60 mg via EPIDURAL

## 2022-10-05 NOTE — Telephone Encounter (Signed)
Order faxed to [facility]

## 2022-10-05 NOTE — Telephone Encounter (Signed)
Order done for EMG of bilateral upper extremities. Please send to [facility] in [place].   He needs this done prior to 6/30. If cannot get in at [facility], then can send him to [facility].   He is to let me know when EMG has been scheduled so we can schedule his f/u with me to review it.

## 2022-10-12 NOTE — Telephone Encounter (Signed)
10/19/2022 

## 2022-10-25 NOTE — Telephone Encounter (Signed)
[facility] confirmed he did have his EMG on 5/22, results are not in care everywhere yet.

## 2022-10-28 NOTE — Telephone Encounter (Signed)
No EMG results yet in his chart

## 2022-11-01 ENCOUNTER — Other Ambulatory Visit: Payer: Self-pay

## 2022-11-01 DIAGNOSIS — F411 Generalized anxiety disorder: Secondary | ICD-10-CM

## 2022-11-01 DIAGNOSIS — M10061 Idiopathic gout, right knee: Secondary | ICD-10-CM

## 2022-11-01 MED ORDER — ALLOPURINOL 100 MG PO TABS
100.0000 mg | ORAL_TABLET | Freq: Every day | ORAL | 3 refills | Status: AC
Start: 2022-11-01 — End: ?

## 2022-11-01 MED ORDER — ALPRAZOLAM 0.25 MG PO TABS
0.2500 mg | ORAL_TABLET | Freq: Two times a day (BID) | ORAL | 2 refills | Status: AC | PRN
Start: 2022-11-01 — End: ?

## 2022-11-03 NOTE — Telephone Encounter (Signed)
Please schedule f/u with me to review results. Can do in person or phone visit- whichever he prefers.

## 2022-11-03 NOTE — Telephone Encounter (Signed)
His results are scanned into his media tab. Will he need an appt with you again?

## 2022-11-03 NOTE — Telephone Encounter (Signed)
Telephone visit sched for Mon 6/10 @ 8:30

## 2022-11-04 NOTE — Progress Notes (Unsigned)
Telephone Visit- Progress Note: Referring Physician:  Dorothey Baseman, MD [address] [place],  [place] [address]  Primary Physician:  Jose Baseman, MD  This visit was performed via telephone.  Patient location: home Provider location: office  I spent a total of 10 minutes non-face-to-face activities for this visit on the date of this encounter including review of current clinical condition and response to treatment.    Patient has given verbal consent to this telephone visits and we reviewed the limitations of a telephone visit. Patient wishes to proceed.    Chief Complaint:  review EMG results  History of Present Illness: Jose Moody is a 67 y.o. male has a history of HTN, GERD, and gout.    He has been seeing Dr. Mariah Moody for his lumbar spine and had bilateral L5-S1 TF ESI on 09/12/22. He has known diffuse lumbar DDD/spondylosis with severe central stenosis L2s-L5 and mild/moderate foraminal stenosis L5-S1. LBP likely multifactorial. Does not appear to be symptomatic from stenosis.    Did phone visit with me on 10/05/22 for his neck pain. He has known diffuse cervical spondylosis with multilevel foraminal and central stenosis. Mild/moderate central stenosis at C5-C6.    Neck pain likely is multifactorial. Numbness and tingling were suspicious for carpal tunnel (positive phalens bilaterally at last visit, worse at night). He has no arm pain.   Phone visit to review his EMG results.   He had C7-T1 IL ESI at [facility] on 10/05/22. He has seen good improvement in his neck pain with this injection. He still has neck pain but it is tolerable. He continues with numbness and tingling.    Conservative measures:  Physical therapy: No PT for back and neck.  Multimodal medical therapy including regular antiinflammatories: neurontin, indomethacin, voltaren gel, medrol dose pack Injections:  C7-T1 IL ESI on 10/05/22 at [facility] bilateral L5-S1 TF ESI on 09/12/22 bilateral L5-S1 TFESI 06/30/2022 with  90% improvement    Past Surgery: no cervical or lumbar surgery    Exam: No exam done as this was a telephone encounter.     Imaging: EMG of bilateral upper extremities dated 10/19/22:  Impression:  This is an abnormal electrodiagnostic exam consistent with:  Right moderate (grade III) carpal tunnel syndrome (median nerve entrapment at wrist).  Left mild (grade II) carpal tunnel syndrome (median nerve entrapment at wrist).  Bilateral chronic C7 radiculopathies.    Assessment and Plan: [name] is a pleasant 67 y.o. male with history of injury at work in 2010 with constant neck pain.   He has seen improvement in neck pain with ESI. Current pain is tolerable. He continues with numbness and tingling.   He has known diffuse cervical spondylosis with multilevel foraminal and central stenosis. Mild/moderate central stenosis at C5-C6.   EMG shows moderate right and mild left carpal tunnel syndrome. It also shows bilateral chronic C7 radiculopathy.   Neck pain likely is multifactorial. Numbness and tingling are likely more from carpal tunnel as they are worse at night. He has no arm pain.   No signs of cervical myelopathy. No dexterity issues. Some issues with knees, but no gross balance issues.    Treatment options discussed with patient and following plan made:   - Can repeat cervical ESI if needed.  - He will get OTC carpal tunnel braces to wear at night.  - He is losing his insurance and will not have coverage until November. Discussed referral to Dr. Katrinka Moody and he wants to wait until November.  -  Will message him in November to see if he wants to be scheduled with Dr. Katrinka Moody for CTS.  - No previous signs of cervical myelopathy. No dexterity issues. Some issues with knees, but no gross balance issues. Will follow.  - Follow up prn with me at his request.   Jose Leach PA-C Neurosurgery

## 2022-11-07 ENCOUNTER — Ambulatory Visit (INDEPENDENT_AMBULATORY_CARE_PROVIDER_SITE_OTHER): Payer: 59 | Admitting: Orthopedic Surgery

## 2022-11-07 ENCOUNTER — Encounter: Payer: Self-pay | Admitting: Orthopedic Surgery

## 2022-11-07 DIAGNOSIS — M4802 Spinal stenosis, cervical region: Secondary | ICD-10-CM | POA: Diagnosis not present

## 2022-11-07 DIAGNOSIS — M5030 Other cervical disc degeneration, unspecified cervical region: Secondary | ICD-10-CM | POA: Diagnosis not present

## 2022-11-07 DIAGNOSIS — M47812 Spondylosis without myelopathy or radiculopathy, cervical region: Secondary | ICD-10-CM

## 2022-11-07 DIAGNOSIS — G5603 Carpal tunnel syndrome, bilateral upper limbs: Secondary | ICD-10-CM | POA: Diagnosis not present

## 2022-12-27 ENCOUNTER — Other Ambulatory Visit: Payer: Self-pay | Admitting: Physician Assistant

## 2022-12-27 DIAGNOSIS — E782 Mixed hyperlipidemia: Secondary | ICD-10-CM

## 2023-02-03 ENCOUNTER — Other Ambulatory Visit: Payer: Self-pay | Admitting: Physician Assistant

## 2023-02-03 DIAGNOSIS — F5101 Primary insomnia: Secondary | ICD-10-CM

## 2023-02-07 ENCOUNTER — Other Ambulatory Visit: Payer: Self-pay

## 2023-02-07 DIAGNOSIS — F5101 Primary insomnia: Secondary | ICD-10-CM

## 2023-02-08 ENCOUNTER — Other Ambulatory Visit: Payer: Self-pay

## 2023-02-08 DIAGNOSIS — F5101 Primary insomnia: Secondary | ICD-10-CM

## 2023-02-08 MED ORDER — ZOLPIDEM TARTRATE 10 MG PO TABS
10.0000 mg | ORAL_TABLET | Freq: Every evening | ORAL | 5 refills | Status: DC | PRN
Start: 2023-02-08 — End: 2023-02-08

## 2023-02-08 MED ORDER — ZOLPIDEM TARTRATE 10 MG PO TABS
10.0000 mg | ORAL_TABLET | Freq: Every evening | ORAL | 5 refills | Status: AC | PRN
Start: 2023-02-08 — End: 2024-03-26

## 2023-05-01 HISTORY — PX: REPLACEMENT TOTAL KNEE: SUR1224

## 2023-05-03 ENCOUNTER — Ambulatory Visit
Admission: RE | Admit: 2023-05-03 | Discharge: 2023-05-03 | Disposition: A | Discharge status: Home / Self Care | Payer: Medicare Other | Source: Ambulatory Visit | Attending: Physician Assistant | Admitting: Physician Assistant

## 2023-05-03 ENCOUNTER — Other Ambulatory Visit: Payer: Self-pay | Admitting: Physician Assistant

## 2023-05-03 DIAGNOSIS — G8918 Other acute postprocedural pain: Secondary | ICD-10-CM

## 2023-07-22 NOTE — Progress Notes (Unsigned)
 Referring Physician:  Elijah Birk, MD [address] [place],  [place] [address]  Primary Physician:  Dorothey Baseman, MD  History of Present Illness: Jose Moody has a history of HTN, GERD, and gout.   I have seen him for his neck and for bilateral carpal tunnel syndrome (per EMG on 10/19/22) in the past.   He has been seeing Dr. Mariah Milling for his lumbar spine. Has known severe central stenosis L2-L5 with multilevel foraminal stenosis per lumbar MRI 06/08/22.   He had left TKA at [facility] in December of 2024.   He has intermittent LBP with no leg pain. Pain is worse with standing and walking. Some relief when he sits to rest. He is having trouble sleeping due to back pain. He has numbness in left knee since before his TKA. No numbness, tingling in right leg. No weakness in his legs. Grocery cart helps.   He had pain relief with last ESI on 07/10/23 for a few days and then it returned. Dr. Mariah Milling has discussed SCS with him.   He continues on neurontin 300mg  bid.   He does not smoke.   Bowel/Bladder Dysfunction: none  Conservative measures:  Physical therapy: No PT for back and neck.  Multimodal medical therapy including regular antiinflammatories: neurontin, indomethacin, voltaren gel, medrol dose pack Injections:  bilateral L5-S1 TFESI 07/10/2023 with Celestone initial improvement but then pain returned  bilateral L3, L4 medial branch and dorsal rami RFA 03/17/2023 with 50% relief  bilateral L5-S1 TFESI 12/16/2022 with Celestone 50% relief  C7-T1 IL ESI on 10/05/22 at [facility] bilateral L5-S1 TF ESI on 09/12/22 bilateral L5-S1 TFESI 06/30/2022 with 90% improvement   Past Surgery: no cervical or lumbar surgery  Jose Moody has some symptoms of cervical myelopathy. Dropping things, but no balance issues.   The symptoms are causing a significant impact on the patient's life.   Review of Systems:  A 10 point review of systems is negative, except for the pertinent  positives and negatives detailed in the HPI.  Past Medical History: Past Medical History:  Diagnosis Date   Arthritis    GERD (gastroesophageal reflux disease)    Heart disease    Hyperlipidemia    Hypertension    Joint pain    Kidney stone     Past Surgical History: Past Surgical History:  Procedure Laterality Date   COLONOSCOPY  2013-2014   HERNIA REPAIR  08/14/14    umbilical hernia   UPPER GI ENDOSCOPY  04/25/1996    Allergies: Allergies as of 07/26/2023   (No Known Allergies)    Medications: Outpatient Encounter Medications as of 07/26/2023  Medication Sig   allopurinol (ZYLOPRIM) 100 MG tablet Take 1 tablet (100 mg total) by mouth daily.   Alpha-Lipoic Acid 200 MG TABS Take by mouth.   ALPRAZolam (XANAX) 0.25 MG tablet Take 1 tablet (0.25 mg total) by mouth 2 (two) times daily as needed for anxiety.   amoxicillin (AMOXIL) 500 MG capsule Take 1 capsule (500 mg total) by mouth 3 (three) times daily.   atorvastatin (LIPITOR) 80 MG tablet Take 1 tablet (80 mg total) by mouth daily.   Cholecalciferol (VITAMIN D3 ADULT GUMMIES PO) Take 1 each by mouth daily.   diclofenac Sodium (VOLTAREN ARTHRITIS PAIN) 1 % GEL Apply 2 Applications topically 4 (four) times daily.   fexofenadine-pseudoephedrine (ALLEGRA-D) 60-120 MG 12 hr tablet Take 1 tablet by mouth 2 (two) times daily.   gabapentin (NEURONTIN) 300 MG capsule Take 1 capsule (  300 mg total) by mouth 3 (three) times daily.   indomethacin (INDOCIN) 50 MG capsule    Lecithin 1000 MG CHEW lecithin   lisinopril-hydrochlorothiazide (ZESTORETIC) 20-25 MG tablet TAKE 1 TABLET BY MOUTH DAILY   methylPREDNISolone (MEDROL DOSEPAK) 4 MG TBPK tablet Take Tapered dose as directed   Multiple Vitamins-Minerals (CENTRUM SILVER ADULT 50+ PO) Centrum Adult 50 Fresh-Fruity   omeprazole (PRILOSEC) 20 MG capsule TAKE 1 CAPSULE BY MOUTH ONCE DAILY   terbinafine (LAMISIL) 250 MG tablet Take 1 tablet (250 mg total) by mouth daily.   terbinafine  (LAMISIL) 250 MG tablet Take 1 tablet (250 mg total) by mouth daily.   zolpidem (AMBIEN) 10 MG tablet Take 1 tablet (10 mg total) by mouth at bedtime as needed for sleep.   No facility-administered encounter medications on file as of 07/26/2023.    Social History: Social History   Tobacco Use   Smoking status: Never   Smokeless tobacco: Never  Substance Use Topics   Alcohol use: Yes   Drug use: No    Family Medical History: Family History  Problem Relation Age of Onset   Diabetes Mother    Heart disease Father     Physical Examination: There were no vitals filed for this visit.    Awake, alert, oriented to person, place, and time.  Speech is clear and fluent. Fund of knowledge is appropriate.   Cranial Nerves: Pupils equal round and reactive to light.  Facial tone is symmetric.    No posterior lumbar tenderness.   No abnormal lesions on exposed skin.   Strength: Side Biceps Triceps Deltoid Interossei Grip Wrist Ext. Wrist Flex.  R 5 5 5 5 5 5 5   L 5 5 5 5 5 5 5    Side Iliopsoas Quads Hamstring PF DF EHL  R 5 5 5 5 5 5   L 5 5 5 5 5 5    Reflexes are 2+ and symmetric at the biceps, triceps, brachioradialis, patella and achilles.   Hoffman's is absent.  Clonus is not present.   Bilateral upper and lower extremity sensation is intact to light touch, some diminished sensation in left lateral thigh.   Gait is normal.     Medical Decision Making  Imaging: none  Assessment and Plan: Jose Moody has intermittent LBP with no leg pain. Pain is worse with standing and walking. Some relief when he sits to rest. No weakness in his legs. Grocery cart helps.   He has known diffuse lumbar DDD/spondylosis with severe central stenosis L2-L5 and multilevel foraminal stenosis per MRI 06/08/22. LBP likely multifactorial. Does not appear to be symptomatic from stenosis.  Treatment options discussed with patient and following plan made:   - Update lumbar MRI to further evaluate  symptoms. No improvement with time, medications, or injections.  - Lumbar xrays with flex/ext to be done when he has MRI.  - New prescription for robaxin to take prn spasms. Reviewed dosing and side effects. Maahs make him sleepy.  - He wants to try stopping neurontin. He is taking 300mg  bid. Will take 300mg  q day x 3 days then stop. Let me know if he wants to restart it.  - Will likely review imaging with one of the surgeons, I doubt he is a surgery candidate as he only has LBP. [place] consider SCS.  - Will schedule phone visit to review imaging once I have the results back and discuss with surgeon.   He continues with numbness and tingling in right > left  hand. He has known bilateral carpal tunnel syndrome per EMG on 10/19/22. He has seen improvement with night bracing. Discussed having him see Dr. Katrinka Blazing to discuss carpal tunnel surgery. He declines for now.   I spent a total of 25 minutes in face-to-face and non-face-to-face activities related to this patient's care today including review of outside records, review of imaging, review of symptoms, physical exam, discussion of differential diagnosis, discussion of treatment options, and documentation.   Drake Leach PA-C Dept. of Neurosurgery

## 2023-07-26 ENCOUNTER — Encounter: Payer: Self-pay | Admitting: Orthopedic Surgery

## 2023-07-26 ENCOUNTER — Ambulatory Visit (INDEPENDENT_AMBULATORY_CARE_PROVIDER_SITE_OTHER): Payer: Medicare Other | Admitting: Orthopedic Surgery

## 2023-07-26 VITALS — BP 136/78 | Ht 72.0 in | Wt 242.0 lb

## 2023-07-26 DIAGNOSIS — M47816 Spondylosis without myelopathy or radiculopathy, lumbar region: Secondary | ICD-10-CM | POA: Diagnosis not present

## 2023-07-26 DIAGNOSIS — M48061 Spinal stenosis, lumbar region without neurogenic claudication: Secondary | ICD-10-CM

## 2023-07-26 DIAGNOSIS — M5136 Other intervertebral disc degeneration, lumbar region with discogenic back pain only: Secondary | ICD-10-CM

## 2023-07-26 MED ORDER — METHOCARBAMOL 500 MG PO TABS
500.0000 mg | ORAL_TABLET | Freq: Three times a day (TID) | ORAL | 0 refills | Status: DC | PRN
Start: 2023-07-26 — End: 2024-01-04

## 2023-07-26 NOTE — Patient Instructions (Signed)
 It was so nice to see you today. Thank you so much for coming in.   I want to get an updated MRI and xrays of your lower back to look into things further. We will get this approved through your insurance and [facility] will call you to schedule the appointment.   [facility] (building with the white pillars) is located off of [address]. The address is [address] [place], [place] [address].    Remind them to do the xrays when you have the MRI done.   After you have the MRI, it takes 14-21 days for me to get the results back. Once I have them, we will call you to schedule a follow up phone visit with me to review them.   If you want to try stopping the gabapentin you would take 300mg  a day for 3 days and then stop it.   I also sent a prescription for methocarbamol to help with muscle spasms. Use only as needed and be careful, this can make you sleepy.   Please do not hesitate to call if you have any questions or concerns. You can also message me in MyChart.   Drake Leach PA-C [phone]     The physicians and staff at [facility] are committed to providing excellent care. You Martinec receive a survey asking for feedback about your experience at our office. We value you your feedback and appreciate you taking the time to to fill it out. The [facility] leadership team is also available to discuss your experience in person, feel free to contact us [phone].

## 2023-08-01 ENCOUNTER — Other Ambulatory Visit: Payer: BLUE CROSS/BLUE SHIELD

## 2023-08-01 ENCOUNTER — Telehealth: Payer: Self-pay | Admitting: Orthopedic Surgery

## 2023-08-01 NOTE — Telephone Encounter (Signed)
 Patient is calling to let our office know that he has started taking the Gabapentin again. He states that the pain returned when he stopped taking them.

## 2023-08-01 NOTE — Telephone Encounter (Signed)
 Okay that he restarted neurontin.

## 2023-08-02 ENCOUNTER — Ambulatory Visit
Admission: RE | Admit: 2023-08-02 | Discharge: 2023-08-02 | Disposition: A | Discharge status: Home / Self Care | Payer: Medicare Other | Source: Ambulatory Visit | Attending: Orthopedic Surgery | Admitting: Orthopedic Surgery

## 2023-08-02 ENCOUNTER — Ambulatory Visit
Admission: RE | Admit: 2023-08-02 | Discharge: 2023-08-02 | Disposition: A | Discharge status: Home / Self Care | Payer: BLUE CROSS/BLUE SHIELD | Source: Ambulatory Visit | Attending: Orthopedic Surgery | Admitting: Orthopedic Surgery

## 2023-08-02 DIAGNOSIS — M47816 Spondylosis without myelopathy or radiculopathy, lumbar region: Secondary | ICD-10-CM

## 2023-08-02 DIAGNOSIS — M48061 Spinal stenosis, lumbar region without neurogenic claudication: Secondary | ICD-10-CM | POA: Insufficient documentation

## 2023-08-02 DIAGNOSIS — M5136 Other intervertebral disc degeneration, lumbar region with discogenic back pain only: Secondary | ICD-10-CM

## 2023-08-18 ENCOUNTER — Encounter: Payer: Self-pay | Admitting: Orthopedic Surgery

## 2023-08-18 NOTE — Progress Notes (Signed)
 MRI lumbar spine dated 08/02/23:  FINDINGS: Segmentation:  Standard.   Alignment:  Physiologic.   Vertebrae:  No fracture, evidence of discitis, or bone lesion.   Conus medullaris and cauda equina: Conus extends to the T12 level. Conus and cauda equina appear normal.   Paraspinal and other soft tissues: Negative.   Disc levels:   T12 - L1 No disc protrusion. No foraminal stenosis. No central canal stenosis.   L1 - L2 central spondylitic disc herniation flattening the thecal sac without foraminal encroachment unchanged since prior examination.   L2 - L3 central spondylitic disc herniation bone bridge complex flattening the thecal sac and extends bilaterally into both neural foramina producing central canal stenosis and bilateral foraminal encroachment unchanged since prior examination.   L3 - L4 central right and left paracentral small spondylitic disc herniation flattening the thecal sac and encroaching on both neural foramina unchanged since prior examination   L4 - L5 central spondylitic disc herniation flattening the thecal sac and encroaching on both neural foramina right worse than left, findings unchanged since prior examination aggravated by the hypertrophic osteoarthritic changes of the corresponding facet joints right more than left   L5 - S1 small central bulging disc flattening the thecal sac. No significant foraminal encroachment.   IMPRESSION: Multilevel degenerative disc disease with spondylitic disc herniations and central canal stenosis as described above. Findings are unchanged since prior examination.     Electronically Signed   By: Shaaron Adler M.D.   On: 08/16/2023 19:55   Lumbar xrays dated 08/02/23:  FINDINGS: Advanced degenerative disc disease with narrowing of the L2-3 disc space. With a large bridging syndesmophytes osteophytes. Significant anterior osteophytic changes and posterior bony spondylosis.   Narrowing of the L4-5 disc space.    Advanced degenerative facet disease L4-5 and L5-S1   Flexion-extension images demonstrates no ligamentous instability.   No fractures.   IMPRESSION: Advanced degenerative disc disease with narrowing of the L2-3 disc space. With a large bridging syndesmophytes osteophytes. Significant anterior osteophytic changes and posterior bony spondylosis. Narrowing of the L4-5 disc space. Advanced degenerative facet disease L4-5 and L5-S1.     Electronically Signed   By: Shaaron Adler M.D.   On: 08/16/2023 19:52  I have personally reviewed the images and agree with the above interpretation.  Above imaging reviewed with Dr. Myer Haff. He Van[place]ingerden be surgical candidate, but would need to do PT prior. Will set up phone visit with patient and let him know.

## 2023-08-23 NOTE — Progress Notes (Unsigned)
 Telephone Visit- Progress Note: Referring Physician:  Dorothey Baseman, MD [address] [place],  [place] [address]  Primary Physician:  Dorothey Baseman, MD  This visit was performed via telephone.  Patient location: home Provider location: office  I spent a total of 10 minutes non-face-to-face activities for this visit on the date of this encounter including review of current clinical condition and response to treatment.    Patient has given verbal consent to this telephone visits and we reviewed the limitations of a telephone visit. Patient wishes to proceed.    Chief Complaint:  review lumbar MRI  History of Present Illness: Jose Moody is a 68 y.o. male has a history of HTN, GERD, and gout.    I have seen him for his neck and for bilateral carpal tunnel syndrome (per EMG on 10/19/22) in the past. Discussed referral to Dr. Katrinka Blazing at last visit and he declined.    He had left TKA at [facility] in December of 2024.   I saw him on 07/26/23 for his LBP. He has known diffuse lumbar DDD/spondylosis with severe central stenosis L2-L5 and multilevel foraminal stenosis per MRI 06/08/22. LBP likely multifactorial. Does not appear to be symptomatic from stenosis.   He was given robaxin at his last visit. He tried to stop neurontin, but restarted it. Phone visit scheduled to review his lumbar MRI/xrays.  He is having more constant LBP with intermittent bilateral aching pain/tired feelings in both legs with walking or standing. He feels like he gets tired very quickly. Some relief when he sits to rest. He is still having trouble sleeping due to back pain. He has numbness in left knee since before his TKA. No numbness, tingling in right leg. Grocery cart helps.   Some relief with robaxin prn. He continues on neurontin 300mg  bid.    He does not smoke.    Bowel/Bladder Dysfunction: none   Conservative measures:  Physical therapy: No PT for back and neck.  Multimodal medical therapy  including regular antiinflammatories: neurontin, indomethacin, voltaren gel, medrol dose pack Injections:  bilateral L5-S1 TFESI 07/10/2023 with Celestone initial improvement but then pain returned  bilateral L3, L4 medial branch and dorsal rami RFA 03/17/2023 with 50% relief  bilateral L5-S1 TFESI 12/16/2022 with Celestone 50% relief  C7-T1 IL ESI on 10/05/22 at [facility] bilateral L5-S1 TF ESI on 09/12/22 bilateral L5-S1 TFESI 06/30/2022 with 90% improvement    Past Surgery: no cervical or lumbar surgery   Jose Moody has some symptoms of cervical myelopathy. Dropping things, but no balance issues.    The symptoms are causing a significant impact on the patient's life.    Exam: No exam done as this was a telephone encounter.     Imaging: Lumbar xrays dated 08/02/23:  FINDINGS: Advanced degenerative disc disease with narrowing of the L2-3 disc space. With a large bridging syndesmophytes osteophytes. Significant anterior osteophytic changes and posterior bony spondylosis.   Narrowing of the L4-5 disc space.   Advanced degenerative facet disease L4-5 and L5-S1   Flexion-extension images demonstrates no ligamentous instability.   No fractures.   IMPRESSION: Advanced degenerative disc disease with narrowing of the L2-3 disc space. With a large bridging syndesmophytes osteophytes. Significant anterior osteophytic changes and posterior bony spondylosis. Narrowing of the L4-5 disc space. Advanced degenerative facet disease L4-5 and L5-S1.     Electronically Signed   By: Shaaron Adler M.D.   On: 08/16/2023 19:52   MRI of lumbar spine dated 08/02/23:  FINDINGS: Segmentation:  Standard.   Alignment:  Physiologic.   Vertebrae:  No fracture, evidence of discitis, or bone lesion.   Conus medullaris and cauda equina: Conus extends to the T12 level. Conus and cauda equina appear normal.   Paraspinal and other soft tissues: Negative.   Disc levels:   T12 - L1 No disc protrusion. No  foraminal stenosis. No central canal stenosis.   L1 - L2 central spondylitic disc herniation flattening the thecal sac without foraminal encroachment unchanged since prior examination.   L2 - L3 central spondylitic disc herniation bone bridge complex flattening the thecal sac and extends bilaterally into both neural foramina producing central canal stenosis and bilateral foraminal encroachment unchanged since prior examination.   L3 - L4 central right and left paracentral small spondylitic disc herniation flattening the thecal sac and encroaching on both neural foramina unchanged since prior examination   L4 - L5 central spondylitic disc herniation flattening the thecal sac and encroaching on both neural foramina right worse than left, findings unchanged since prior examination aggravated by the hypertrophic osteoarthritic changes of the corresponding facet joints right more than left   L5 - S1 small central bulging disc flattening the thecal sac. No significant foraminal encroachment.   IMPRESSION: Multilevel degenerative disc disease with spondylitic disc herniations and central canal stenosis as described above. Findings are unchanged since prior examination.     Electronically Signed   By: Shaaron Adler M.D.   On: 08/16/2023 19:55        I have personally reviewed the images and agree with the above interpretation.  Above imaging reviewed with Dr. Myer Haff prior to this visit.   Assessment and Plan: Mr. Talent is having more constant LBP with intermittent bilateral aching pain/tired feelings in both legs with walking or standing. He feels like he gets tired very quickly. Some relief when he sits to rest.   He has known diffuse lumbar DDD/spondylosis with moderate/severe central stenosis L2-L5 and multilevel foraminal stenosis. DDD at L2-L3 is severe. No instability seen on flex/ext xrays.    Treatment options discussed with patient and following plan made:   -  Discussed he Soland be surgical candidate but would need to do PT prior to any further surgical discussion.  - PT for lumbar spine. Orders to [facility] at his request.  - Continue on robaxin prn spasms. Reviewed dosing and side effects. [place] make him sleepy.  - Continue neurontin 300mg  bid.  - Will call him in 4-6 weeks to check on progress with PT. Once he's done 6 weeks of PT or is discharged, will schedule him a follow up with Dr. Myer Haff to discuss possible surgery options.   Drake Leach PA-C Neurosurgery

## 2023-08-24 ENCOUNTER — Encounter: Payer: Self-pay | Admitting: Orthopedic Surgery

## 2023-08-24 ENCOUNTER — Ambulatory Visit (INDEPENDENT_AMBULATORY_CARE_PROVIDER_SITE_OTHER): Admitting: Orthopedic Surgery

## 2023-08-24 DIAGNOSIS — M4726 Other spondylosis with radiculopathy, lumbar region: Secondary | ICD-10-CM

## 2023-08-24 DIAGNOSIS — M5136 Other intervertebral disc degeneration, lumbar region with discogenic back pain only: Secondary | ICD-10-CM | POA: Diagnosis not present

## 2023-08-24 DIAGNOSIS — M48061 Spinal stenosis, lumbar region without neurogenic claudication: Secondary | ICD-10-CM

## 2023-08-24 DIAGNOSIS — M5416 Radiculopathy, lumbar region: Secondary | ICD-10-CM

## 2023-08-24 DIAGNOSIS — M47816 Spondylosis without myelopathy or radiculopathy, lumbar region: Secondary | ICD-10-CM

## 2023-09-12 ENCOUNTER — Encounter: Payer: Self-pay | Admitting: Student in an Organized Health Care Education/Training Program

## 2023-09-12 ENCOUNTER — Ambulatory Visit
Payer: BLUE CROSS/BLUE SHIELD | Attending: Student in an Organized Health Care Education/Training Program | Admitting: Student in an Organized Health Care Education/Training Program

## 2023-09-12 ENCOUNTER — Ambulatory Visit
Admission: RE | Admit: 2023-09-12 | Discharge: 2023-09-12 | Disposition: A | Discharge status: Home / Self Care | Source: Ambulatory Visit | Attending: Student in an Organized Health Care Education/Training Program | Admitting: Student in an Organized Health Care Education/Training Program

## 2023-09-12 VITALS — BP 127/80 | HR 73 | Temp 98.5°F | Ht 72.0 in | Wt 245.0 lb

## 2023-09-12 DIAGNOSIS — M4726 Other spondylosis with radiculopathy, lumbar region: Secondary | ICD-10-CM | POA: Diagnosis not present

## 2023-09-12 DIAGNOSIS — M47816 Spondylosis without myelopathy or radiculopathy, lumbar region: Secondary | ICD-10-CM

## 2023-09-12 DIAGNOSIS — M5416 Radiculopathy, lumbar region: Secondary | ICD-10-CM | POA: Diagnosis present

## 2023-09-12 DIAGNOSIS — G8929 Other chronic pain: Secondary | ICD-10-CM

## 2023-09-12 DIAGNOSIS — M5134 Other intervertebral disc degeneration, thoracic region: Secondary | ICD-10-CM

## 2023-09-12 DIAGNOSIS — M546 Pain in thoracic spine: Secondary | ICD-10-CM | POA: Insufficient documentation

## 2023-09-12 DIAGNOSIS — M5136 Other intervertebral disc degeneration, lumbar region with discogenic back pain only: Secondary | ICD-10-CM | POA: Diagnosis present

## 2023-09-12 NOTE — Patient Instructions (Addendum)
 [facility] brochure and my card to fax results Antimicrobial scrub Thoracic MRI Schedule Medtronic SCS trial ( give pt Medtronic brochure)   Moderate Conscious Sedation, Adult, Care After After the procedure, it is common to have: Sleepiness for a few hours. Impaired judgment for a few hours. Trouble with balance. Nausea or vomiting if you eat too soon. Follow these instructions at home: For the time period you were told by your health care provider:  Rest. Do not participate in activities where you could fall or become injured. Do not drive or use machinery. Do not drink alcohol. Do not take sleeping pills or medicines that cause drowsiness. Do not make important decisions or sign legal documents. Do not take care of children on your own. Eating and drinking Follow instructions from your health care provider about what you Zanella eat and drink. Drink enough fluid to keep your urine pale yellow. If you vomit: Drink clear fluids slowly and in small amounts as you are able. Clear fluids include water, ice chips, low-calorie sports drinks, and fruit juice that has water added to it (diluted fruit juice). Eat light and bland foods in small amounts as you are able. These foods include bananas, applesauce, rice, lean meats, toast, and crackers. General instructions Take over-the-counter and prescription medicines only as told by your health care provider. Have a responsible adult stay with you for the time you are told. Do not use any products that contain nicotine or tobacco. These products include cigarettes, chewing tobacco, and vaping devices, such as e-cigarettes. If you need help quitting, ask your health care provider. Return to your normal activities as told by your health care provider. Ask your health care provider what activities are safe for you. Your health care provider Bugbee give you more instructions. Make sure you know what you can and cannot do. Contact a health care provider  if: You are still sleepy or having trouble with balance after 24 hours. You feel light-headed. You vomit every time you eat or drink. You get a rash. You have a fever. You have redness or swelling around the IV site. Get help right away if: You have trouble breathing. You start to feel confused at home. These symptoms Gandolfi be an emergency. Get help right away. Call 911. Do not wait to see if the symptoms will go away. Do not drive yourself to the hospital. This information is not intended to replace advice given to you by your health care provider. Make sure you discuss any questions you have with your health care provider. Document Revised: 11/29/2021 Document Reviewed: 11/29/2021 Elsevier Patient Education  2024 [employer]GENERAL RISKS AND COMPLICATIONS  What are the risk, side effects and possible complications? Generally speaking, most procedures are safe.  However, with any procedure there are risks, side effects, and the possibility of complications.  The risks and complications are dependent upon the sites that are lesioned, or the type of nerve block to be performed.  The closer the procedure is to the spine, the more serious the risks are.  Great care is taken when placing the radio frequency needles, block needles or lesioning probes, but sometimes complications can occur. Infection: Any time there is an injection through the skin, there is a risk of infection.  This is why sterile conditions are used for these blocks.  There are four possible types of infection. Localized skin infection. Central Nervous System Infection-This can be in the form of Meningitis, which can be deadly. Epidural Infections-This can be in  the form of an epidural abscess, which can cause pressure inside of the spine, causing compression of the spinal cord with subsequent paralysis. This would require an emergency surgery to decompress, and there are no guarantees that the patient would recover from the  paralysis. Discitis-This is an infection of the intervertebral discs.  It occurs in about 1% of discography procedures.  It is difficult to treat and it Duffus lead to surgery.        2. Pain: the needles have to go through skin and soft tissues, will cause soreness.       3. Damage to internal structures:  The nerves to be lesioned Skalla be near blood vessels or    other nerves which can be potentially damaged.       4. Bleeding: Bleeding is more common if the patient is taking blood thinners such as  aspirin, Coumadin, Ticiid, Plavix, etc., or if he/she have some genetic predisposition  such as hemophilia. Bleeding into the spinal canal can cause compression of the spinal  cord with subsequent paralysis.  This would require an emergency surgery to  decompress and there are no guarantees that the patient would recover from the  paralysis.       5. Pneumothorax:  Puncturing of a lung is a possibility, every time a needle is introduced in  the area of the chest or upper back.  Pneumothorax refers to free air around the  collapsed lung(s), inside of the thoracic cavity (chest cavity).  Another two possible  complications related to a similar event would include: Hemothorax and Chylothorax.   These are variations of the Pneumothorax, where instead of air around the collapsed  lung(s), you Santone have blood or chyle, respectively.       6. Spinal headaches: They Faley occur with any procedures in the area of the spine.       7. Persistent CSF (Cerebro-Spinal Fluid) leakage: This is a rare problem, but Devin occur  with prolonged intrathecal or epidural catheters either due to the formation of a fistulous  track or a dural tear.       8. Nerve damage: By working so close to the spinal cord, there is always a possibility of  nerve damage, which could be as serious as a permanent spinal cord injury with  paralysis.       9. Death:  Although rare, severe deadly allergic reactions known as "Anaphylactic  reaction" can occur  to any of the medications used.      10. Worsening of the symptoms:  We can always make thing worse.  What are the chances of something like this happening? Chances of any of this occuring are extremely low.  By statistics, you have more of a chance of getting killed in a motor vehicle accident: while driving to the hospital than any of the above occurring .  Nevertheless, you should be aware that they are possibilities.  In general, it is similar to taking a shower.  Everybody knows that you can slip, hit your head and get killed.  Does that mean that you should not shower again?  Nevertheless always keep in mind that statistics do not mean anything if you happen to be on the wrong side of them.  Even if a procedure has a 1 (one) in a 1,000,000 (million) chance of going wrong, it you happen to be that one..Also, keep in mind that by statistics, you have more of a chance of having something go wrong  when taking medications.  Who should not have this procedure? If you are on a blood thinning medication (e.g. Coumadin, Plavix, see list of "Blood Thinners"), or if you have an active infection going on, you should not have the procedure.  If you are taking any blood thinners, please inform your physician.  How should I prepare for this procedure? Do not eat or drink anything at least six hours prior to the procedure. Bring a driver with you .  It cannot be a taxi. Come accompanied by an adult that can drive you back, and that is strong enough to help you if your legs get weak or numb from the local anesthetic. Take all of your medicines the morning of the procedure with just enough water to swallow them. If you have diabetes, make sure that you are scheduled to have your procedure done first thing in the morning, whenever possible. If you have diabetes, take only half of your insulin dose and notify our nurse that you have done so as soon as you arrive at the clinic. If you are diabetic, but only take  blood sugar pills (oral hypoglycemic), then do not take them on the morning of your procedure.  You Maddocks take them after you have had the procedure. Do not take aspirin or any aspirin-containing medications, at least eleven (11) days prior to the procedure.  They Melamed prolong bleeding. Wear loose fitting clothing that Youtsey be easy to take off and that you would not mind if it got stained with Betadine or blood. Do not wear any jewelry or perfume Remove any nail coloring.  It will interfere with some of our monitoring equipment.  NOTE: Remember that this is not meant to be interpreted as a complete list of all possible complications.  Unforeseen problems Bambrick occur.  BLOOD THINNERS The following drugs contain aspirin or other products, which can cause increased bleeding during surgery and should not be taken for 2 weeks prior to and 1 week after surgery.  If you should need take something for relief of minor pain, you Matkins take acetaminophen which is found in Tylenol,m Datril, Anacin-3 and Panadol. It is not blood thinner. The products listed below are.  Do not take any of the products listed below in addition to any listed on your instruction sheet.  A.P.C or A.P.C with Codeine Codeine Phosphate Capsules #3 Ibuprofen Ridaura  ABC compound Congesprin Imuran rimadil  Advil Cope Indocin Robaxisal  Alka-Seltzer Effervescent Pain Reliever and Antacid Coricidin or Coricidin-D  Indomethacin Rufen  Alka-Seltzer plus Cold Medicine Cosprin Ketoprofen S-A-C Tablets  Anacin Analgesic Tablets or Capsules Coumadin Korlgesic Salflex  Anacin Extra Strength Analgesic tablets or capsules CP-2 Tablets Lanoril Salicylate  Anaprox Cuprimine Capsules Levenox Salocol  Anexsia-D Dalteparin Magan Salsalate  Anodynos Darvon compound Magnesium Salicylate Sine-off  Ansaid Dasin Capsules Magsal Sodium Salicylate  Anturane Depen Capsules Marnal Soma  APF Arthritis pain formula Dewitt's Pills Measurin Stanback  Argesic  Dia-Gesic Meclofenamic Sulfinpyrazone  Arthritis Bayer Timed Release Aspirin Diclofenac Meclomen Sulindac  Arthritis pain formula Anacin Dicumarol Medipren Supac  Analgesic (Safety coated) Arthralgen Diffunasal Mefanamic Suprofen  Arthritis Strength Bufferin Dihydrocodeine Mepro Compound Suprol  Arthropan liquid Dopirydamole Methcarbomol with Aspirin Synalgos  ASA tablets/Enseals Disalcid Micrainin Tagament  Ascriptin Doan's Midol Talwin  Ascriptin A/D Dolene Mobidin Tanderil  Ascriptin Extra Strength Dolobid Moblgesic Ticlid  Ascriptin with Codeine Doloprin or Doloprin with Codeine Momentum Tolectin  Asperbuf Duoprin Mono-gesic Trendar  Aspergum Duradyne Motrin or Motrin IB Triminicin  Aspirin  plain, buffered or enteric coated Durasal Myochrisine Trigesic  Aspirin Suppositories Easprin Nalfon Trillsate  Aspirin with Codeine Ecotrin Regular or Extra Strength Naprosyn Uracel  Atromid-S Efficin Naproxen Ursinus  Auranofin Capsules Elmiron Neocylate Vanquish  Axotal Emagrin Norgesic Verin  Azathioprine Empirin or Empirin with Codeine Normiflo Vitamin E  Azolid Emprazil Nuprin Voltaren  Bayer Aspirin plain, buffered or children's or timed BC Tablets or powders Encaprin Orgaran Warfarin Sodium  Buff-a-Comp Enoxaparin Orudis Zorpin  Buff-a-Comp with Codeine Equegesic Os-Cal-Gesic   Buffaprin Excedrin plain, buffered or Extra Strength Oxalid   Bufferin Arthritis Strength Feldene Oxphenbutazone   Bufferin plain or Extra Strength Feldene Capsules Oxycodone with Aspirin   Bufferin with Codeine Fenoprofen Fenoprofen Pabalate or Pabalate-SF   Buffets II Flogesic Panagesic   Buffinol plain or Extra Strength Florinal or Florinal with Codeine Panwarfarin   Buf-Tabs Flurbiprofen Penicillamine   Butalbital Compound Four-way cold tablets Penicillin   Butazolidin Fragmin Pepto-Bismol   Carbenicillin Geminisyn Percodan   Carna Arthritis Reliever Geopen Persantine   Carprofen Gold's salt Persistin    Chloramphenicol Goody's Phenylbutazone   Chloromycetin Haltrain Piroxlcam   Clmetidine heparin Plaquenil   Cllnoril Hyco-pap Ponstel   Clofibrate Hydroxy chloroquine Propoxyphen         Before stopping any of these medications, be sure to consult the physician who ordered them.  Some, such as Coumadin (Warfarin) are ordered to prevent or treat serious conditions such as "deep thrombosis", "pumonary embolisms", and other heart problems.  The amount of time that you Blackard need off of the medication Manas also vary with the medication and the reason for which you were taking it.  If you are taking any of these medications, please make sure you notify your pain physician before you undergo any procedures.         Spinal Cord Stimulation Trial Information A spinal cord stimulation trial is a test to see whether a spinal cord stimulator reduces your pain. A spinal cord stimulator is a small device that is inserted (implanted) in your back. The stimulator has small wires (leads) that connect it to your spinal cord. The stimulator sends electrical pulses through the leads to the spinal cord. This can relieve pain. Settings for the stimulator can be adjusted with a remote device to find the best pain control. Your health care provider Avellino suggest a spinal cord stimulation trial if other treatments for long-lasting (chronic) pain have not worked for you. Spinal cord stimulation Spring be used to manage pain that is caused by: Failed back surgery. Heart pain (angina) or a blood vessel problem (peripheral vascular disease). Pain after amputation (phantom limb sensation). Nerve-related pain. Complex regional pain syndrome. Painful inflammation of a thin membrane that covers the brain and spinal cord (arachnoiditis). Other syndromes that involve chronic pain or injuries related to the spinal cord. For the trial, the stimulator is attached to your back instead of inserted under the skin. A trial period is  usually 3-5 days, but this can vary among health care providers. After your trial period, you and your health care provider will discuss whether a permanent spinal cord stimulator is an option for you. The permanent stimulator Nuttle be an option depending on: Whether the stimulator reduces your pain during the trial. Whether the stimulator fits into your lifestyle. Whether the cost of the stimulator is covered by your insurance. What are the risks? Generally, a spinal cord stimulation trial is safe. However, problems can occur, including: Bleeding or pain at the insertion site of the  leads. Infection at the insertion site or around the leads. Allergic reactions to medicines, devices, or dyes. Damage to the skin, nerves, back muscles, or spinal cord where the leads are placed. Inability to move the legs (paralysis). Numbness in the legs. Inability to control when you urinate or have a bowel movement (incontinence). Spinal fluid leakage. How is a spinal cord stimulator placed for a trial?  For a trial period, the stimulator generator and battery will be outside the body, typically on a belt that you will wear around your waist. Only the leads that connect the stimulator to the spinal cord are implanted under your skin. The exact location of the stimulator depends on where you have pain. There are two types of surgery for implanting the leads: Noninvasive surgery. In this type of surgery, a small incision is made and needles are used to place the leads under your skin. Open surgery. In this type of surgery, a larger incision is made, and the leads are implanted directly into your back. How to care for yourself during the trial period Incision care Check your incisions every day for signs of infection. Check for: Redness, swelling, or pain. Fluid or blood. Warmth. Pus or a bad smell. Activity Return to your normal activities as told by your health care provider. Ask your health care provider  what activities are safe for you. You Littman have to avoid lifting. Ask your health care provider how much you can safely lift. General instructions Follow your health care provider's instructions about how to take care of your spinal cord stimulator and your incision. Make sure you write down the following information so that you can share this information with your health care provider: Your responses to the stimulator. Describe these as told by your health care provider. Your pain level throughout the day. The amount and kind of pain medicine that you take. Do not take baths, swim, or use a hot tub until your health care provider approves. Ask your health care provider if you Housh take showers. You Hagmann only be allowed to take sponge baths. Take over-the-counter and prescription medicines only as told by your health care provider. Tell all health care providers who provide care for you that you have a spinal cord stimulator. This is important information that could affect the medical treatment that you receive. Keep all follow-up visits. This is important. Contact a health care provider if: You have any of these signs of infection: Redness, swelling, or pain around your incision. Fluid or blood coming from your incision. Warmth coming from your incision. Pus or a bad smell coming from your incision. A fever. Get help right away if: Your pain gets worse. The stimulator leads come out. You develop numbness or weakness in your legs, or you have difficulty walking. You have problems urinating or having a bowel movement. You have symptoms that last for more than 2-3 days or your symptoms suddenly get worse. These symptoms Hagan be an emergency. Get help right away. Call 911. Do not wait to see if the symptoms will go away. Do not drive yourself to the hospital. Summary A spinal cord stimulator is a small device that sends electrical pulses to your spinal cord. This can relieve pain caused by many  different health conditions. Before a permanent stimulator is placed, you will have a trial using a temporary stimulator that is not implanted under your skin. This helps determine if a stimulator will reduce your pain. For the trial, only the leads  that connect the stimulator to the spinal cord are implanted under your skin. During the trial period, make sure you write down information about your pain and your responses to the stimulator so that you can share this information with your health care provider. Keep all follow-up visits. This is important. Contact your health care provider if you have problems during the trial. This information is not intended to replace advice given to you by your health care provider. Make sure you discuss any questions you have with your health care provider. Document Revised: 02/16/2021 Document Reviewed: 02/16/2021 Elsevier Patient Education  2024 [employer].

## 2023-09-12 NOTE — Progress Notes (Signed)
 Safety precautions to be maintained throughout the outpatient stay will include: orient to surroundings, keep bed in low position, maintain call bell within reach at all times, provide assistance with transfer out of bed and ambulation.

## 2023-09-12 NOTE — Progress Notes (Signed)
 PROVIDER NOTE: Interpretation of information contained herein should be left to medically-trained personnel. Specific patient instructions are provided elsewhere under "Patient Instructions" section of medical record. This document was created in part using AI and STT-dictation technology, any transcriptional errors that Merta result from this process are unintentional.  Patient: Jose Moody  Service: E/M Encounter  Provider: Edward Jolly, MD  DOB: [birth year]  Delivery: Face-to-face  Specialty: Interventional Pain Management  MRN: [id number]  Setting: Ambulatory outpatient facility  Specialty designation: 09  Type: New Patient  Location: Outpatient office facility  PCP: Dorothey Baseman, MD  DOS: 09/12/2023    Referring Prov.: Elijah Birk, MD   Primary Reason(s) for Visit: Encounter for initial evaluation of one or more chronic problems (new to examiner) potentially causing chronic pain, and posing a threat to normal musculoskeletal function. (Level of risk: High) CC: Back Pain (Lower and upper back)  HPI  [name] is a 68 y.o. year old, male patient, who comes for the first time to our practice referred by Filomena Jungling I, MD for our initial evaluation of his chronic pain. He has Umbilical hernia without obstruction and without gangrene; DJD (degenerative joint disease) of thoracic spine; Generalized anxiety disorder; Insomnia; ANA positive; Anxiety; Benign essential hypertension; Gastroesophageal reflux disease; Idiopathic chronic gout of multiple sites without tophus; Left knee pain; Primary osteoarthritis involving multiple joints; Lumbar radiculopathy; and Degeneration of intervertebral disc of lumbar region with discogenic back pain on their problem list. Today he comes in for evaluation of his Back Pain (Lower and upper back)  Pain Assessment: Location: Upper, Lower Back Radiating: radiates down both legs to bottom of feet Onset: More than a month ago Duration: Chronic pain Quality:  Sharp, Constant, Tingling Severity: 8 /10 (subjective, self-reported pain score)  Effect on ADL: limits ADLs Timing: Constant Modifying factors: ice, brace BP: 127/80  HR: 73  Onset and Duration: Date of onset: 2010 Cause of pain: Work related accident or event Severity: Getting worse, NAS-11 at its worse: 10/10, NAS-11 at its best: 8/10, NAS-11 now: 8/10, and NAS-11 on the average: 8/10 Timing: Not influenced by the time of the day, During activity or exercise, After activity or exercise, and After a period of immobility Aggravating Factors: Bending, Climbing, Intercourse (sex), Kneeling, Lifiting, Motion, Prolonged sitting, Prolonged standing, Squatting, Stooping , Twisting, Walking, Walking uphill, Walking downhill, and Working Alleviating Factors: Cold packs, Using a brace, and Warm showers or baths Associated Problems: Dizziness, Fatigue, Numbness, Personality changes, Pain that wakes patient up, and Pain that does not allow patient to sleep Quality of Pain: Agonizing, Annoying, Constant, Deep, Distressing, Dreadful, Exhausting, Getting longer, Nagging, Pressure-like, Shooting, Tingling, and Uncomfortable Previous Examinations or Tests: MRI scan, Nerve block, X-rays, Nerve conduction test, Neurological evaluation, and Neurosurgical evaluation Previous Treatments: none noted  [name] is being evaluated for possible interventional pain management therapies for the treatment of his chronic pain.   Discussed the use of AI scribe software for clinical note transcription with the patient, who gave verbal consent to proceed.  History of Present Illness   Jose Moody is a 68 year old male who presents for consideration of a spinal cord stimulator for chronic lower back pain. He was referred by Dr. Mariah Milling for evaluation of his chronic lower back pain and consideration of a spinal cord stimulator.  He has been experiencing lower back pain for the past eight months, which radiates into both  legs, with the left leg being more affected. Numbness in the left leg  was present before his recent knee replacement surgery and persists post-surgery. The pain extends all the way down his legs.  He has undergone various treatments including epidurals, nerve blocks, and ablations, but continues to experience significant pain. He is trying to avoid further surgery and is considering a spinal cord stimulator as a potential treatment option.  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 He wants to return to activities such as playing golf, but currently finds it difficult due to his pain.  He has been on gabapentin for over three and a half years, which has not been effective in managing his pain. Additionally, he takes half of a 0.25 mg Xanax as needed.  His pain significantly impacts his daily activities, including walking across the house or yard and standing for tasks like peeling potatoes. He describes the pain as 'shooting' and notes that it affects his mood and interactions with others.      Injections done with Dr. Aleen Ammons Chronic low back pain with bilateral lumbar radiculitis -s/p bilateral L5-S1 TFESI 09/12/2022 with Celestone 80% relief -s/p bilateral L5-S1 TFESI 12/16/2022 with Celestone 50% relief -s/p bilateral L3, L4 medial branch and dorsal rami RFA 03/17/2023 with 50% relief -s/p bilateral L5-S1 TFESI 07/10/2023 with Celestone initial improvement but then pain returned   Meds   Current Outpatient Medications:    allopurinol (ZYLOPRIM) 100 MG tablet, Take 1 tablet (100 mg total) by mouth daily., Disp: 90 tablet, Rfl: 3   ALPRAZolam (XANAX) 0.25 MG tablet, Take 1 tablet (0.25 mg total) by mouth 2 (two) times daily as needed for anxiety., Disp: 60 tablet, Rfl: 2   atorvastatin (LIPITOR) 80 MG tablet, Take 1 tablet (80 mg total) by mouth daily., Disp: 90 tablet, Rfl: 3   diclofenac Sodium  (VOLTAREN ARTHRITIS PAIN) 1 % GEL, Apply 2 Applications topically 4 (four) times daily., Disp: , Rfl:    gabapentin (NEURONTIN) 300 MG capsule, Take 1 capsule (300 mg total) by mouth 3 (three) times daily., Disp: 270 capsule, Rfl: 3   indomethacin (INDOCIN) 50 MG capsule, , Disp: , Rfl:    Lecithin 1000 MG CHEW, lecithin, Disp: , Rfl:    levocetirizine (XYZAL) 5 MG tablet, Take 5 mg by mouth every evening., Disp: , Rfl:    lisinopril-hydrochlorothiazide (ZESTORETIC) 20-25 MG tablet, TAKE 1 TABLET BY MOUTH DAILY, Disp: 90 tablet, Rfl: 3   Melatonin 10 MG CAPS, Take 10 mg by mouth daily., Disp: , Rfl:    methocarbamol (ROBAXIN) 500 MG tablet, Take 1 tablet (500 mg total) by mouth every 8 (eight) hours as needed for muscle spasms., Disp: 60 tablet, Rfl: 0   Multiple Vitamins-Minerals (CENTRUM SILVER ADULT 50+ PO), Centrum Adult 50 Fresh-Fruity, Disp: , Rfl:    omeprazole (PRILOSEC) 20 MG capsule, TAKE 1 CAPSULE BY MOUTH ONCE DAILY, Disp: 90 capsule, Rfl: 3   Alpha-Lipoic Acid 200 MG TABS, Take by mouth. (Patient not taking: Reported on 09/12/2023), Disp: , Rfl:    amoxicillin (AMOXIL) 500 MG capsule, Take 1 capsule (500 mg total) by mouth 3 (three) times daily. (Patient not taking: Reported on 09/12/2023), Disp: 30 capsule, Rfl: 0   Cholecalciferol (VITAMIN D3 ADULT GUMMIES PO), Take 1 each by mouth daily. (Patient not taking: Reported on 09/12/2023), Disp: , Rfl:    fexofenadine-pseudoephedrine (ALLEGRA-D) 60-120 MG 12 hr tablet, Take 1 tablet by mouth 2 (two) times daily. (Patient not taking: Reported on 09/12/2023), Disp:  20 tablet, Rfl: 0   zolpidem (AMBIEN) 10 MG tablet, Take 1 tablet (10 mg total) by mouth at bedtime as needed for sleep. (Patient not taking: Reported on 09/12/2023), Disp: 30 tablet, Rfl: 5  Imaging Review  Cervical Imaging: Cervical MR wo contrast: Results for orders placed during the hospital encounter of 09/20/22  MR CERVICAL SPINE WO CONTRAST  Narrative CLINICAL DATA:   68 year old male with neck pain. Pain radiating to the right arm with numbness for 6 weeks.  EXAM: MRI CERVICAL SPINE WITHOUT CONTRAST  TECHNIQUE: Multiplanar, multisequence MR imaging of the cervical spine was performed. No intravenous contrast was administered.  COMPARISON:  Thoracic spine MRI 07/26/2009.  FINDINGS: Alignment: Straightening of cervical lordosis not significantly changed from visible cervical spine on the 2011 MRI. No significant scoliosis or spondylolisthesis.  Vertebrae: Widespread vertebral endplate and facet spurring in the cervical spine. Normal background bone marrow signal. No convincing No marrow edema or evidence of acute osseous abnormality.  Cord: Multilevel degenerative cervical spinal cord mass effect (detailed below) with no cord signal abnormality identified.  Posterior Fossa, vertebral arteries, paraspinal tissues: Cervicomedullary junction is within normal limits. Negative visible posterior fossa. Major vascular flow voids in the neck are preserved. Left vertebral artery appears slightly dominant. Negative visible neck soft tissues.  Disc levels:  C2-C3: Mostly foraminal disc bulging and endplate spurring. Mild to moderate facet hypertrophy greater on the left. Mild ligament flavum hypertrophy. No spinal stenosis. Moderate to severe left and moderate right C3 foraminal stenosis.  C3-C4: Similar mostly foraminal disc bulging and endplate spurring eccentric to the left. Moderate facet hypertrophy greater on the left. Mild spinal stenosis with up to mild ventral cord mass effect (series 5, image 8). Severe bilateral C4 foraminal stenosis.  C4-C5: Circumferential disc osteophyte complex with a broad-based posterior component. Moderate facet and ligament flavum hypertrophy worse on the right. Mild spinal stenosis and spinal cord mass effect. Moderate left but severe right C5 foraminal stenosis.  C5-C6: Bulky circumferential disc osteophyte  complex. Broad-based posterior component. Mild to moderate facet and moderate ligament flavum hypertrophy. Mild to moderate spinal stenosis and spinal cord mass effect (series 5, image 8). Severe bilateral C6 foraminal stenosis.  C6-C7: Circumferential disc osteophyte complex with broad-based posterior and foraminal involvement. More mild facet, mild to moderate ligament flavum hypertrophy. Mild spinal stenosis. Up to mild cord mass effect. Moderate to severe left, severe right C7 foraminal stenosis.  C7-T1: Mild endplate spurring. Moderate facet hypertrophy. No spinal stenosis. Mild bilateral C8 foraminal stenosis.  No visible upper thoracic spinal stenosis, but there is upper thoracic facet arthropathy also.  IMPRESSION: 1. Widespread bulky cervical spine degeneration. No acute osseous abnormality.  2. Mild to moderate multifactorial spinal stenosis AND spinal cord mass effect at C5-C6. Mild spinal stenosis with up to mild cord mass effect C3-C4, C4-C5, and C6-C7. No associated spinal cord signal abnormality.  3. Associated severe neural foraminal stenosis at bilateral C4, right C5, bilateral C6, and right C7 nerve levels. With generally moderate cervical foraminal stenosis elsewhere.   Electronically Signed By: Odessa Fleming M.D. On: 09/25/2022 11:49   MR LUMBAR SPINE WO CONTRAST  Narrative CLINICAL DATA:  Low back pain  EXAM: MRI LUMBAR SPINE WITHOUT CONTRAST  TECHNIQUE: Multiplanar, multisequence MR imaging of the lumbar spine was performed. No intravenous contrast was administered.  COMPARISON:  MRI lumbar spine June 08, 2022  FINDINGS: Segmentation:  Standard.  Alignment:  Physiologic.  Vertebrae:  No fracture, evidence of discitis, or bone lesion.  Conus  medullaris and cauda equina: Conus extends to the T12 level. Conus and cauda equina appear normal.  Paraspinal and other soft tissues: Negative.  Disc levels:  T12 - L1 No disc protrusion. No  foraminal stenosis. No central canal stenosis.  L1 - L2 central spondylitic disc herniation flattening the thecal sac without foraminal encroachment unchanged since prior examination.  L2 - L3 central spondylitic disc herniation bone bridge complex flattening the thecal sac and extends bilaterally into both neural foramina producing central canal stenosis and bilateral foraminal encroachment unchanged since prior examination.  L3 - L4 central right and left paracentral small spondylitic disc herniation flattening the thecal sac and encroaching on both neural foramina unchanged since prior examination  L4 - L5 central spondylitic disc herniation flattening the thecal sac and encroaching on both neural foramina right worse than left, findings unchanged since prior examination aggravated by the hypertrophic osteoarthritic changes of the corresponding facet joints right more than left  L5 - S1 small central bulging disc flattening the thecal sac. No significant foraminal encroachment.  IMPRESSION: Multilevel degenerative disc disease with spondylitic disc herniations and central canal stenosis as described above. Findings are unchanged since prior examination.   Electronically Signed By: Fredrich Jefferson M.D. On: 08/16/2023 19:55   DG Lumbar Spine Complete  Narrative CLINICAL DATA:  Low back pain, lumbar spondylosis  EXAM: LUMBAR SPINE - COMPLETE 4+ VIEW  COMPARISON:  MRI lumbar spine August 02, 2023 MRI lumbar spine June 08, 2022  FINDINGS: Advanced degenerative disc disease with narrowing of the L2-3 disc space. With a large bridging syndesmophytes osteophytes. Significant anterior osteophytic changes and posterior bony spondylosis.  Narrowing of the L4-5 disc space.  Advanced degenerative facet disease L4-5 and L5-S1  Flexion-extension images demonstrates no ligamentous instability.  No fractures.  IMPRESSION: Advanced degenerative disc disease with narrowing  of the L2-3 disc space. With a large bridging syndesmophytes osteophytes. Significant anterior osteophytic changes and posterior bony spondylosis. Narrowing of the L4-5 disc space. Advanced degenerative facet disease L4-5 and L5-S1.   Electronically Signed By: Fredrich Jefferson M.D. On: 08/16/2023 19:52  DG Knee Complete 4 Views Left  Narrative CLINICAL DATA:  Acute pain of left knee. Knee pain and swelling. Patient reports pain for 8 weeks. History of gout.  EXAM: LEFT KNEE - COMPLETE 4+ VIEW  COMPARISON:  None.  FINDINGS: Standing AP, lateral, bilateral oblique as well as patellar sunrise views obtained. There is mild medial tibiofemoral joint space narrowing. Lateral patellar tilt. Peripheral spurring of the medial tibiofemoral and patellofemoral compartment. Quadriceps tendon enthesophyte. Small to moderate knee joint effusion. Mild edema in Hoffa's fat pad. No erosion, bony destruction or focal bone lesion.  IMPRESSION: 1. Mild osteoarthritis involving the medial tibiofemoral and patellofemoral compartments. Mild lateral patellar tilt. 2. Small to moderate knee joint effusion.   Electronically Signed By: Chadwick Colonel M.D. On: 09/06/2020 12:55  Complexity Note: Imaging results reviewed.                         ROS  Cardiovascular: High blood pressure and Needs antibiotics prior to dental procedures Pulmonary or Respiratory: Snoring  Neurological: No reported neurological signs or symptoms such as seizures, abnormal skin sensations, urinary and/or fecal incontinence, being born with an abnormal open spine and/or a tethered spinal cord Psychological-Psychiatric: Anxiousness Gastrointestinal: Heartburn due to stomach pushing into lungs (Hiatal hernia) and Reflux or heatburn Genitourinary: Passing kidney stones Hematological: No reported hematological signs or symptoms such as prolonged bleeding,  low or poor functioning platelets, bruising or bleeding easily,  hereditary bleeding problems, low energy levels due to low hemoglobin or being anemic Endocrine: No reported endocrine signs or symptoms such as high or low blood sugar, rapid heart rate due to high thyroid levels, obesity or weight gain due to slow thyroid or thyroid disease Rheumatologic: Joint aches and or swelling due to excess weight (Osteoarthritis) Musculoskeletal: Negative for myasthenia gravis, muscular dystrophy, multiple sclerosis or malignant hyperthermia Work History: Retired  Allergies  Mr. Sheahan has no known allergies.  Laboratory Chemistry Profile   Renal Lab Results  Component Value Date   BUN 13 04/01/2022   CREATININE 1.11 04/01/2022   BCR 12 04/01/2022   GFRAA 97 03/17/2020   GFRNONAA 84 03/17/2020   SPECGRAV 1.015 04/01/2022   PHUR 7.0 04/01/2022   PROTEINUR Negative 04/01/2022     Electrolytes Lab Results  Component Value Date   NA 135 04/01/2022   K 3.9 04/01/2022   CL 97 04/01/2022   CALCIUM 9.7 04/01/2022   PHOS 2.7 (L) 04/01/2022     Hepatic Lab Results  Component Value Date   AST 25 07/19/2022   ALT 42 07/19/2022   ALBUMIN 4.2 07/19/2022   ALKPHOS 78 07/19/2022   LIPASE 20 08/29/2016     ID No results found for: "LYMEIGGIGMAB", "HIV", "SARSCOV2NAA", "STAPHAUREUS", "MRSAPCR", "HCVAB", "PREGTESTUR", "RMSFIGG", "QFVRPH1IGG", "QFVRPH2IGG"   Bone No results found for: "VD25OH", "VD125OH2TOT", "ON6295MW4", "XL2440NU2", "25OHVITD1", "25OHVITD2", "25OHVITD3", "TESTOFREE", "TESTOSTERONE"   Endocrine Lab Results  Component Value Date   GLUCOSE 110 (H) 04/01/2022   GLUCOSEU NEGATIVE 08/29/2016   TSH 1.920 04/01/2022     Neuropathy No results found for: "VITAMINB12", "FOLATE", "HGBA1C", "HIV"   CNS No results found for: "COLORCSF", "APPEARCSF", "RBCCOUNTCSF", "WBCCSF", "POLYSCSF", "LYMPHSCSF", "EOSCSF", "PROTEINCSF", "GLUCCSF", "JCVIRUS", "CSFOLI", "IGGCSF", "LABACHR", "ACETBL"   Inflammation (CRP: Acute  ESR: Chronic) Lab Results  Component  Value Date   ESRSEDRATE 7 04/04/2022     Rheumatology Lab Results  Component Value Date   ANA Positive (A) 04/04/2022   LABURIC 6.9 04/01/2022     Coagulation Lab Results  Component Value Date   PLT 130 (L) 04/01/2022     Cardiovascular Lab Results  Component Value Date   TROPONINI <0.03 08/29/2016   HGB 17.7 04/01/2022   HCT 50.1 04/01/2022     Screening No results found for: "SARSCOV2NAA", "COVIDSOURCE", "STAPHAUREUS", "MRSAPCR", "HCVAB", "HIV", "PREGTESTUR"   Cancer No results found for: "CEA", "CA125", "LABCA2"   Allergens No results found for: "ALMOND", "APPLE", "ASPARAGUS", "AVOCADO", "BANANA", "BARLEY", "BASIL", "BAYLEAF", "GREENBEAN", "LIMABEAN", "WHITEBEAN", "BEEFIGE", "REDBEET", "BLUEBERRY", "BROCCOLI", "CABBAGE", "MELON", "CARROT", "CASEIN", "CASHEWNUT", "CAULIFLOWER", "CELERY"     Note: Lab results reviewed.  PFSH  Drug: Mr. Willets  reports no history of drug use. Alcohol:  reports current alcohol use. Tobacco:  reports that he has quit smoking. His smoking use included cigarettes. He has never used smokeless tobacco. Medical:  has a past medical history of Arthritis, GERD (gastroesophageal reflux disease), Heart disease, Hyperlipidemia, Hypertension, Joint pain, and Kidney stone. Family: family history includes Diabetes in his mother; Heart disease in his father.  Past Surgical History:  Procedure Laterality Date   COLONOSCOPY  2013-2014   HERNIA REPAIR  08/14/2014   umbilical hernia   REPLACEMENT TOTAL KNEE Left 05/01/2023   UPPER GI ENDOSCOPY  04/25/1996   Active Ambulatory Problems    Diagnosis Date Noted   Umbilical hernia without obstruction and without gangrene 07/29/2014   DJD (degenerative joint disease) of thoracic spine 07/26/2009  Generalized anxiety disorder 11/09/2021   Insomnia 11/09/2021   ANA positive 05/02/2022   Anxiety 05/13/2022   Benign essential hypertension 05/13/2022   Gastroesophageal reflux disease 05/13/2022   Idiopathic  chronic gout of multiple sites without tophus 05/02/2022   Left knee pain 05/13/2022   Primary osteoarthritis involving multiple joints 05/02/2022   Lumbar radiculopathy 09/12/2023   Degeneration of intervertebral disc of lumbar region with discogenic back pain 09/12/2023   Resolved Ambulatory Problems    Diagnosis Date Noted   No Resolved Ambulatory Problems   Past Medical History:  Diagnosis Date   Arthritis    GERD (gastroesophageal reflux disease)    Heart disease    Hyperlipidemia    Hypertension    Joint pain    Kidney stone    Constitutional Exam  General appearance: Well nourished, well developed, and well hydrated. In no apparent acute distress Vitals:   09/12/23 0903  BP: 127/80  Pulse: 73  Temp: 98.5 F (36.9 C)  SpO2: 99%  Weight: 245 lb (111.1 kg)  Height: 6' (1.829 m)   BMI Assessment: Estimated body mass index is 33.23 kg/m as calculated from the following:   Height as of this encounter: 6' (1.829 m).   Weight as of this encounter: 245 lb (111.1 kg).  BMI interpretation table: BMI level Category Range association with higher incidence of chronic pain  <18 kg/m2 Underweight   18.5-24.9 kg/m2 Ideal body weight   25-29.9 kg/m2 Overweight Increased incidence by 20%  30-34.9 kg/m2 Obese (Class I) Increased incidence by 68%  35-39.9 kg/m2 Severe obesity (Class II) Increased incidence by 136%  >40 kg/m2 Extreme obesity (Class III) Increased incidence by 254%   Patient's current BMI Ideal Body weight  Body mass index is 33.23 kg/m. Ideal body weight: 77.6 kg (171 lb 1.2 oz) Adjusted ideal body weight: 91 kg (200 lb 10.3 oz)   BMI Readings from Last 4 Encounters:  09/12/23 33.23 kg/m  07/26/23 32.82 kg/m  09/16/22 33.26 kg/m  04/04/22 31.33 kg/m   Wt Readings from Last 4 Encounters:  09/12/23 245 lb (111.1 kg)  07/26/23 242 lb (109.8 kg)  09/16/22 245 lb 3.2 oz (111.2 kg)  04/04/22 244 lb (110.7 kg)    Psych/Mental status: Alert, oriented x 3  (person, place, & time)       Eyes: PERLA Respiratory: No evidence of acute respiratory distress Thoracic Spine Area Exam  Skin & Axial Inspection: No masses, redness, or swelling Alignment: Symmetrical Functional ROM: Unrestricted ROM Stability: No instability detected Muscle Tone/Strength: Functionally intact. No obvious neuro-muscular anomalies detected. Sensory (Neurological): Musculoskeletal pain pattern Muscle strength & Tone: No palpable anomalies Lumbar Spine Area Exam  Skin & Axial Inspection: No masses, redness, or swelling Alignment: Symmetrical Functional ROM: Pain restricted ROM affecting both sides Stability: No instability detected Muscle Tone/Strength: Functionally intact. No obvious neuro-muscular anomalies detected. Sensory (Neurological): Neurogenic pain pattern Palpation: No palpable anomalies       Provocative Tests: Hyperextension/rotation test: (+) due to pain. Lumbar quadrant test (Kemp's test): (+) bilateral for foraminal stenosis  Gait & Posture Assessment  Ambulation: Unassisted Gait: Relatively normal for age and body habitus Posture: WNL  Lower Extremity Exam    Side: Right lower extremity  Side: Left lower extremity  Stability: No instability observed          Stability: No instability observed          Skin & Extremity Inspection: Skin color, temperature, and hair growth are WNL. No peripheral edema or  cyanosis. No masses, redness, swelling, asymmetry, or associated skin lesions. No contractures.  Skin & Extremity Inspection: Skin color, temperature, and hair growth are WNL. No peripheral edema or cyanosis. No masses, redness, swelling, asymmetry, or associated skin lesions. No contractures.  Functional ROM: Unrestricted ROM                  Functional ROM: Unrestricted ROM                  Muscle Tone/Strength: Functionally intact. No obvious neuro-muscular anomalies detected.  Muscle Tone/Strength: Functionally intact. No obvious neuro-muscular  anomalies detected.  Sensory (Neurological): Unimpaired        Sensory (Neurological): Unimpaired        DTR: Patellar: deferred today Achilles: deferred today Plantar: deferred today  DTR: Patellar: deferred today Achilles: deferred today Plantar: deferred today  Palpation: No palpable anomalies  Palpation: No palpable anomalies    Assessment  Primary Diagnosis & Pertinent Problem List: The primary encounter diagnosis was Chronic radicular lumbar pain. Diagnoses of Lumbar radiculopathy, Lumbar facet arthropathy, Lumbar spondylosis, Degeneration of intervertebral disc of lumbar region with discogenic back pain, Other intervertebral disc degeneration, thoracic region, and Chronic midline thoracic back pain were also pertinent to this visit.  Visit Diagnosis (New problems to examiner): 1. Chronic radicular lumbar pain   2. Lumbar radiculopathy   3. Lumbar facet arthropathy   4. Lumbar spondylosis   5. Degeneration of intervertebral disc of lumbar region with discogenic back pain   6. Other intervertebral disc degeneration, thoracic region   7. Chronic midline thoracic back pain    Plan of Care (Initial workup plan)  The patient presents with chronic, intractable pain involving both mechanical low back pain and neurogenic components radiating into the lower extremities. Given the patient's history of chronic radiculopathy, and limited response to conservative and interventional therapies, a spinal cord stimulator (SCS) trial is being considered. While SCS is typically more effective for neuropathic and appendicular pain, we discussed that some patients Aycock also experience relief in axial low back and hip pain. The potential benefits and risks of spinal cord stimulation were thoroughly reviewed.  The proposed plan includes a percutaneous spinal cord stimulator trial. The patient was informed that this will involve temporary placement of epidural leads connected to an external pulse generator,  which will be used over a 7-day trial period. We discussed the possibility of a mid-trial in-office visit to adjust settings and optimize programming in order to give the patient the best chance of success. The patient will receive daily support from the device representative throughout the trial.  We reviewed the benefits of SCS, which include potential substantial pain relief, reduction in the use of oral pain medications including opioids, and long-term programmable therapy that can reduce reliance on repeated injections and other pain interventions. Risks were also discussed in detail and include potential surgical complications such as infection, bleeding, CSF leak, lead migration or fracture, hardware malfunction, and the possibility of either no pain relief or worsening of symptoms. The patient was advised that spinal cord stimulation is not a guaranteed solution or a "magic bullet," but rather a potentially valuable therapy in appropriately selected cases.  As part of standard protocol, the patient will require a comprehensive psychosocial and behavioral evaluation prior to the trial. A referral was placed to [facility], and the patient was instructed to have the results faxed to our clinic prior to scheduling the procedure. We had a thorough and detailed discussion reviewing the rationale,  alternatives, risks, and expected outcomes. The patient stated that all questions were answered to their satisfaction, demonstrated appropriate understanding, and expressed readiness to proceed. There were no barriers to understanding the treatment plan, and the explanation was well received. The patient is eager to move forward with the spinal cord stimulator trial once the necessary evaluation is complete.    Imaging Orders         DG PAIN CLINIC C-ARM 1-60 MIN NO REPORT         MR THORACIC SPINE WO CONTRAST    Referral Orders         Ambulatory referral to Psychology    Procedure Orders         [place] TRIAL       Provider-requested follow-up: Return in about 29 days (around 10/11/2023) for SCS trial, ECT.  Future Appointments  Date Time Provider Department Center  09/13/2023 11:00 AM [facility]   I discussed the assessment and treatment plan with the patient. The patient was provided an opportunity to ask questions and all were answered. The patient agreed with the plan and demonstrated an understanding of the instructions.  Patient advised to call back or seek an in-person evaluation if the symptoms or condition worsens.  Duration of encounter: .  Total time on encounter, as per AMA guidelines included both the face-to-face and non-face-to-face time personally spent by the physician and/or other qualified health care professional(s) on the day of the encounter (includes time in activities that require the physician or other qualified health care professional and does not include time in activities normally performed by clinical staff). Physician's time [place] include the following activities when performed: Preparing to see the patient (e.g., pre-charting review of records, searching for previously ordered imaging, lab work, and nerve conduction tests) Review of prior analgesic pharmacotherapies. Reviewing PMP Interpreting ordered tests (e.g., lab work, imaging, nerve conduction tests) Performing post-procedure evaluations, including interpretation of diagnostic procedures Obtaining and/or reviewing separately obtained history Performing a medically appropriate examination and/or evaluation Counseling and educating the patient/family/caregiver Ordering medications, tests, or procedures Referring and communicating with other health care professionals (when not separately reported) Documenting clinical information in the electronic or other health record Independently interpreting results (not separately reported) and communicating results to the patient/ family/caregiver Care  coordination (not separately reported)  Note by: Cephus Collin, MD (TTS and AI technology used. I apologize for any typographical errors that were not detected and corrected.) Date: 09/12/2023; Time: 11:03 AM

## 2023-09-13 ENCOUNTER — Ambulatory Visit
Admission: RE | Admit: 2023-09-13 | Discharge: 2023-09-13 | Disposition: A | Discharge status: Home / Self Care | Source: Ambulatory Visit | Attending: Student in an Organized Health Care Education/Training Program | Admitting: Student in an Organized Health Care Education/Training Program

## 2023-09-13 DIAGNOSIS — M546 Pain in thoracic spine: Secondary | ICD-10-CM | POA: Insufficient documentation

## 2023-09-13 DIAGNOSIS — G8929 Other chronic pain: Secondary | ICD-10-CM | POA: Diagnosis present

## 2023-09-13 DIAGNOSIS — M5134 Other intervertebral disc degeneration, thoracic region: Secondary | ICD-10-CM

## 2023-09-18 ENCOUNTER — Telehealth: Payer: Self-pay | Admitting: Student in an Organized Health Care Education/Training Program

## 2023-09-18 NOTE — Telephone Encounter (Signed)
 Patient left vmail stating he does not want to go with SCS Trial. He has decided no to do this. Please notify Dr Rhesa Celeste.

## 2023-10-05 ENCOUNTER — Telehealth: Payer: Self-pay | Admitting: Orthopedic Surgery

## 2023-10-05 NOTE — Telephone Encounter (Signed)
 Patient said that he cancelled all his appointments with physical therapy and Dr.Lateef. He has a brother that is bedridden with rods in his back. Patient does not want leads in his back. If he ever changes his mind he will let us  know.

## 2023-10-05 NOTE — Telephone Encounter (Signed)
 Please call him to see how he is doing with PT at [facility]? Has he done 6 weeks? Jose Moody need to get PT notes.   If he's done 6 weeks and is no better, please schedule appt with Mont Antis (NP appt) to discuss possible lumbar surgery options.   If he's not done 6 weeks, have him call us  when he has finished it. If he's feeling better, he can f/u prn.

## 2023-10-10 NOTE — Telephone Encounter (Signed)
 Patient goes back to physical therapy on June 2nd FYI at [facility]

## 2023-11-20 NOTE — Telephone Encounter (Signed)
 Patient will be completed with PT on July 8th. Patient called to make a follow up appointment for after that date. Patient scheduled July 15th with Dr.Yarbrough

## 2023-12-06 NOTE — H&P (View-Only) (Signed)
 Referring Physician:  Glover Lenis, MD [phone] [place],  [place] [address]  Primary Physician:  Glover Lenis, MD  History of Present Illness: 12/12/2023 [name] is here today with a chief complaint of chronic back pain who presents for evaluation of his back issues. He is accompanied by Jose Moody, his wife.  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 The pain limits his walking to a quarter to half a mile, with discomfort in his back and both legs. He sometimes bends forward while walking to alleviate the discomfort.  He underwent knee replacement surgery on December 2nd, with significant post-surgical pain initially.   His current medication regimen includes gabapentin  and half an Ambien  for sleep, which has improved his sleep pattern. He manages his gout with regular follow-ups and does not take significant pain medication beyond gabapentin .   Back and bilateral leg pain.   PT at [facility] and has been discharged, made his pain worse.     The symptoms are causing a significant impact on the patient's life.    I have utilized the care everywhere function in epic to review the outside records available from external health systems.   History of Present Illness: 08/24/2023 note from Jose Boys, PA-C Jose Moody is a 68 y.o. male has a history of HTN, GERD, and gout.    I have seen him for his neck and for bilateral carpal tunnel syndrome (per [facility] on 10/19/22) in the past. Discussed referral to Dr. Claudene at last visit and he declined.    He had left TKA at [facility] in December of 2024.    I saw him on 07/26/23 for his LBP. He has known diffuse lumbar DDD/spondylosis with severe central stenosis L2-L5 and multilevel foraminal stenosis per MRI 06/08/22. LBP likely multifactorial. Does not appear to be symptomatic from stenosis.    He was given robaxin  at his last visit. He tried to stop neurontin , but restarted  it. Phone visit scheduled to review his lumbar MRI/xrays.   He is having more constant LBP with intermittent bilateral aching pain/tired feelings in both legs with walking or standing. He feels like he gets tired very quickly. Some relief when he sits to rest. He is still having trouble sleeping due to back pain. He has numbness in left knee since before his TKA. No numbness, tingling in right leg. Grocery cart helps.    Some relief with robaxin  prn. He continues on neurontin  300mg  bid.    He does not smoke.    Bowel/Bladder Dysfunction: none   Conservative measures:  Physical therapy: No PT for back and neck.  Multimodal medical therapy including regular antiinflammatories: neurontin , indomethacin , voltaren  gel, medrol  dose pack Injections:  bilateral L5-S1 TFESI 07/10/2023 with Celestone initial improvement but then pain returned  bilateral L3, L4 medial branch and dorsal rami RFA 03/17/2023 with 50% relief  bilateral L5-S1 TFESI 12/16/2022 with Celestone 50% relief  C7-T1 IL ESI on 10/05/22 at [facility] bilateral L5-S1 TF ESI on 09/12/22 bilateral L5-S1 TFESI 06/30/2022 with 90% improvement    Past Surgery: no cervical or lumbar surgery   Jose Moody has some symptoms of cervical myelopathy. Dropping things, but no balance issues.   Review of Systems:  A 10 point review of systems is negative, except for the pertinent positives and negatives detailed in the HPI.  Past Medical History: Past Medical History:  Diagnosis Date   Arthritis  GERD (gastroesophageal reflux disease)    Heart disease    Hyperlipidemia    Hypertension    Joint pain    Kidney stone     Past Surgical History: Past Surgical History:  Procedure Laterality Date   COLONOSCOPY  2013-2014   HERNIA REPAIR  08/14/2014   umbilical hernia   REPLACEMENT TOTAL KNEE Left 05/01/2023   UPPER GI ENDOSCOPY  04/25/1996    Allergies: Allergies as of 12/12/2023   (No Known Allergies)    Medications:  Current  Outpatient Medications:    allopurinol  (ZYLOPRIM ) 100 MG tablet, Take 1 tablet (100 mg total) by mouth daily., Disp: 90 tablet, Rfl: 3   ALPRAZolam  (XANAX ) 0.25 MG tablet, Take 1 tablet (0.25 mg total) by mouth 2 (two) times daily as needed for anxiety., Disp: 60 tablet, Rfl: 2   atorvastatin  (LIPITOR) 80 MG tablet, Take 1 tablet (80 mg total) by mouth daily., Disp: 90 tablet, Rfl: 3   Cholecalciferol (VITAMIN D3 ADULT GUMMIES PO), Take 1 each by mouth daily., Disp: , Rfl:    diclofenac  Sodium (VOLTAREN  ARTHRITIS PAIN) 1 % GEL, Apply 2 Applications topically 4 (four) times daily., Disp: , Rfl:    gabapentin  (NEURONTIN ) 300 MG capsule, Take 1 capsule (300 mg total) by mouth 3 (three) times daily., Disp: 270 capsule, Rfl: 3   indomethacin  (INDOCIN ) 50 MG capsule, , Disp: , Rfl:    levocetirizine (XYZAL) 5 MG tablet, Take 5 mg by mouth every evening., Disp: , Rfl:    lisinopril -hydrochlorothiazide  (ZESTORETIC ) 20-25 MG tablet, TAKE 1 TABLET BY MOUTH DAILY, Disp: 90 tablet, Rfl: 3   Melatonin 10 MG CAPS, Take 10 mg by mouth daily., Disp: , Rfl:    methocarbamol  (ROBAXIN ) 500 MG tablet, Take 1 tablet (500 mg total) by mouth every 8 (eight) hours as needed for muscle spasms., Disp: 60 tablet, Rfl: 0   Multiple Vitamins-Minerals (CENTRUM SILVER ADULT 50+ PO), Centrum Adult 50 Fresh-Fruity, Disp: , Rfl:    omeprazole  (PRILOSEC) 20 MG capsule, TAKE 1 CAPSULE BY MOUTH ONCE DAILY, Disp: 90 capsule, Rfl: 3   zolpidem  (AMBIEN ) 10 MG tablet, Take 1 tablet (10 mg total) by mouth at bedtime as needed for sleep., Disp: 30 tablet, Rfl: 5  Social History: Social History   Tobacco Use   Smoking status: Former    Types: Cigarettes   Smokeless tobacco: Never   Tobacco comments:    Quit around 2002  Substance Use Topics   Alcohol use: Yes   Drug use: No    Family Medical History: Family History  Problem Relation Age of Onset   Diabetes Mother    Heart disease Father     Physical Examination: Vitals:    12/12/23 1009  BP: 138/84    General: Patient is in no apparent distress. Attention to examination is appropriate.  Neck:   Supple.  Full range of motion.  Respiratory: Patient is breathing without any difficulty.   NEUROLOGICAL:     Awake, alert, oriented to person, place, and time.  Speech is clear and fluent.   Cranial Nerves: Pupils equal round and reactive to light.  Facial tone is symmetric.  Facial sensation is symmetric. Shoulder shrug is symmetric. Tongue protrusion is midline.  There is no pronator drift.  Strength: Side Biceps Triceps Deltoid Interossei Grip Wrist Ext. Wrist Flex.  R 5 5 5 5 5 5 5   L 5 5 5 5 5 5 5    Side Iliopsoas Quads Hamstring PF DF EHL  R 5  5 5 5 5 5   L 5 5 5 5 5 5    Reflexes are 1+ and symmetric at the biceps, triceps, brachioradialis, patella and achilles.   Hoffman's is absent.   Bilateral upper and lower extremity sensation is intact to light touch.    No evidence of dysmetria noted.  Gait is antalgic.     Medical Decision Making  Imaging: T spine MRI 09/13/2023 IMPRESSION: 1. No acute fracture or malalignment. 2. Mild canal stenoses at several levels in the thoracic spine secondary to disc bulging and facet arthropathy. 3. Moderate foraminal stenoses bilaterally at T1-T2 and on the right at T10-11 secondary to facet arthropathy.     Electronically Signed   By: Clem Savory M.D.   On: 10/06/2023 11:08  L spine MRI 08/02/2023 IMPRESSION: 1. No acute fracture or malalignment. 2. Mild canal stenoses at several levels in the thoracic spine secondary to disc bulging and facet arthropathy. 3. Moderate foraminal stenoses bilaterally at T1-T2 and on the right at T10-11 secondary to facet arthropathy.     Electronically Signed   By: Clem Savory M.D.   On: 10/06/2023 11:08  L spine Xrays 08/02/2023 IMPRESSION: Advanced degenerative disc disease with narrowing of the L2-3 disc space. With a large bridging syndesmophytes  osteophytes. Significant anterior osteophytic changes and posterior bony spondylosis. Narrowing of the L4-5 disc space. Advanced degenerative facet disease L4-5 and L5-S1.     Electronically Signed   By: Franky Chard M.D.   On: 08/16/2023 19:52  Please note that he also has retrolisthesis at L2-3.   I have personally reviewed the images and agree with the above interpretation.  Assessment and Plan: Mr. Pacha is a pleasant 68 y.o. male with retrolisthesis, back pain with bilateral sciatica, and neurogenic claudication.  Spinal stenosis with neurogenic claudication Chronic spinal stenosis with neurogenic claudication, MRI shows compression at L2-L3, L3-L4, and L4-L5. Conservative treatments failed, surgery required. - Perform L2-L3 fusion, decompression at L3-L4 and L4-L5. - Discussed surgical risks: bleeding, infection, major events. - Explained 80% chance of improvement. - Informed recovery takes months, most improve by six weeks. - Schedule surgery post-vacation, options in August.  I recommended L2-3 XLIF/PSF with L3-5 decompression.  Fusion is required due to back pain, retrolisthesis, and severe disc height loss and degeneration at L2/3.  I discussed the planned procedure at length with the patient, including the risks, benefits, alternatives, and indications. The risks discussed include but are not limited to bleeding, infection, need for reoperation, spinal fluid leak, stroke, vision loss, anesthetic complication, coma, paralysis, and even death. I also described the possibility of psoas weakness and paresthesias. I described in detail that improvement was not guaranteed.  The patient expressed understanding of these risks, and asked that we proceed with surgery. I described the surgery in layman's terms, and gave ample opportunity for questions, which were answered to the best of my ability.  I spent a total of 30 minutes in this patient's care today. This time was spent  reviewing pertinent records including imaging studies, obtaining and confirming history, performing a directed evaluation, formulating and discussing my recommendations, and documenting the visit within the medical record.      Thank you for involving me in the care of this patient.      Nilesh Stegall K. Clois MD, [facility]

## 2023-12-06 NOTE — Progress Notes (Unsigned)
 Referring Physician:  Glover Lenis, MD [phone] [place],  [place] [address]  Primary Physician:  Glover Lenis, MD  History of Present Illness: 12/12/2023 [name] is here today with a chief complaint of chronic back pain who presents for evaluation of his back issues. He is accompanied by Jose Moody, his wife.  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 The pain limits his walking to a quarter to half a mile, with discomfort in his back and both legs. He sometimes bends forward while walking to alleviate the discomfort.  He underwent knee replacement surgery on December 2nd, with significant post-surgical pain initially.   His current medication regimen includes gabapentin  and half an Ambien  for sleep, which has improved his sleep pattern. He manages his gout with regular follow-ups and does not take significant pain medication beyond gabapentin .   Back and bilateral leg pain.   PT at [facility] and has been discharged, made his pain worse.     The symptoms are causing a significant impact on the patient's life.    I have utilized the care everywhere function in epic to review the outside records available from external health systems.   History of Present Illness: 08/24/2023 note from Glade Boys, PA-C Jose Moody is a 68 y.o. male has a history of HTN, GERD, and gout.    I have seen him for his neck and for bilateral carpal tunnel syndrome (per EM[facility] on 10/19/22) in the past. Discussed referral to Dr. Claudene at last visit and he declined.    He had left TKA at [facility] in December of 2024.    I saw him on 07/26/23 for his LBP. He has known diffuse lumbar DDD/spondylosis with severe central stenosis L2-L5 and multilevel foraminal stenosis per MRI 06/08/22. LBP likely multifactorial. Does not appear to be symptomatic from stenosis.    He was given robaxin  at his last visit. He tried to stop neurontin , but restarted  it. Phone visit scheduled to review his lumbar MRI/xrays.   He is having more constant LBP with intermittent bilateral aching pain/tired feelings in both legs with walking or standing. He feels like he gets tired very quickly. Some relief when he sits to rest. He is still having trouble sleeping due to back pain. He has numbness in left knee since before his TKA. No numbness, tingling in right leg. Grocery cart helps.    Some relief with robaxin  prn. He continues on neurontin  300mg  bid.    He does not smoke.    Bowel/Bladder Dysfunction: none   Conservative measures:  Physical therapy: No PT for back and neck.  Multimodal medical therapy including regular antiinflammatories: neurontin , indomethacin , voltaren  gel, medrol  dose pack Injections:  bilateral L5-S1 TFESI 07/10/2023 with Celestone initial improvement but then pain returned  bilateral L3, L4 medial branch and dorsal rami RFA 03/17/2023 with 50% relief  bilateral L5-S1 TFESI 12/16/2022 with Celestone 50% relief  C7-T1 IL ESI on 10/05/22 at [facility] bilateral L5-S1 TF ESI on 09/12/22 bilateral L5-S1 TFESI 06/30/2022 with 90% improvement    Past Surgery: no cervical or lumbar surgery   Jose Moody has some symptoms of cervical myelopathy. Dropping things, but no balance issues.   Review of Systems:  A 10 point review of systems is negative, except for the pertinent positives and negatives detailed in the HPI.  Past Medical History: Past Medical History:  Diagnosis Date   Arthritis  GERD (gastroesophageal reflux disease)    Heart disease    Hyperlipidemia    Hypertension    Joint pain    Kidney stone     Past Surgical History: Past Surgical History:  Procedure Laterality Date   COLONOSCOPY  2013-2014   HERNIA REPAIR  08/14/2014   umbilical hernia   REPLACEMENT TOTAL KNEE Left 05/01/2023   UPPER GI ENDOSCOPY  04/25/1996    Allergies: Allergies as of 12/12/2023   (No Known Allergies)    Medications:  Current  Outpatient Medications:    allopurinol  (ZYLOPRIM ) 100 MG tablet, Take 1 tablet (100 mg total) by mouth daily., Disp: 90 tablet, Rfl: 3   ALPRAZolam  (XANAX ) 0.25 MG tablet, Take 1 tablet (0.25 mg total) by mouth 2 (two) times daily as needed for anxiety., Disp: 60 tablet, Rfl: 2   atorvastatin  (LIPITOR) 80 MG tablet, Take 1 tablet (80 mg total) by mouth daily., Disp: 90 tablet, Rfl: 3   Cholecalciferol (VITAMIN D3 ADULT GUMMIES PO), Take 1 each by mouth daily., Disp: , Rfl:    diclofenac  Sodium (VOLTAREN  ARTHRITIS PAIN) 1 % GEL, Apply 2 Applications topically 4 (four) times daily., Disp: , Rfl:    gabapentin  (NEURONTIN ) 300 MG capsule, Take 1 capsule (300 mg total) by mouth 3 (three) times daily., Disp: 270 capsule, Rfl: 3   indomethacin  (INDOCIN ) 50 MG capsule, , Disp: , Rfl:    levocetirizine (XYZAL) 5 MG tablet, Take 5 mg by mouth every evening., Disp: , Rfl:    lisinopril -hydrochlorothiazide  (ZESTORETIC ) 20-25 MG tablet, TAKE 1 TABLET BY MOUTH DAILY, Disp: 90 tablet, Rfl: 3   Melatonin 10 MG CAPS, Take 10 mg by mouth daily., Disp: , Rfl:    methocarbamol  (ROBAXIN ) 500 MG tablet, Take 1 tablet (500 mg total) by mouth every 8 (eight) hours as needed for muscle spasms., Disp: 60 tablet, Rfl: 0   Multiple Vitamins-Minerals (CENTRUM SILVER ADULT 50+ PO), Centrum Adult 50 Fresh-Fruity, Disp: , Rfl:    omeprazole  (PRILOSEC) 20 MG capsule, TAKE 1 CAPSULE BY MOUTH ONCE DAILY, Disp: 90 capsule, Rfl: 3   zolpidem  (AMBIEN ) 10 MG tablet, Take 1 tablet (10 mg total) by mouth at bedtime as needed for sleep., Disp: 30 tablet, Rfl: 5  Social History: Social History   Tobacco Use   Smoking status: Former    Types: Cigarettes   Smokeless tobacco: Never   Tobacco comments:    Quit around 2002  Substance Use Topics   Alcohol use: Yes   Drug use: No    Family Medical History: Family History  Problem Relation Age of Onset   Diabetes Mother    Heart disease Father     Physical Examination: Vitals:    12/12/23 1009  BP: 138/84    General: Patient is in no apparent distress. Attention to examination is appropriate.  Neck:   Supple.  Full range of motion.  Respiratory: Patient is breathing without any difficulty.   NEUROLOGICAL:     Awake, alert, oriented to person, place, and time.  Speech is clear and fluent.   Cranial Nerves: Pupils equal round and reactive to light.  Facial tone is symmetric.  Facial sensation is symmetric. Shoulder shrug is symmetric. Tongue protrusion is midline.  There is no pronator drift.  Strength: Side Biceps Triceps Deltoid Interossei Grip Wrist Ext. Wrist Flex.  R 5 5 5 5 5 5 5   L 5 5 5 5 5 5 5    Side Iliopsoas Quads Hamstring PF DF EHL  R 5  5 5 5 5 5   L 5 5 5 5 5 5    Reflexes are 1+ and symmetric at the biceps, triceps, brachioradialis, patella and achilles.   Hoffman's is absent.   Bilateral upper and lower extremity sensation is intact to light touch.    No evidence of dysmetria noted.  Gait is antalgic.     Medical Decision Making  Imaging: T spine MRI 09/13/2023 IMPRESSION: 1. No acute fracture or malalignment. 2. Mild canal stenoses at several levels in the thoracic spine secondary to disc bulging and facet arthropathy. 3. Moderate foraminal stenoses bilaterally at T1-T2 and on the right at T10-11 secondary to facet arthropathy.     Electronically Signed   By: Clem Savory M.D.   On: 10/06/2023 11:08  L spine MRI 08/02/2023 IMPRESSION: 1. No acute fracture or malalignment. 2. Mild canal stenoses at several levels in the thoracic spine secondary to disc bulging and facet arthropathy. 3. Moderate foraminal stenoses bilaterally at T1-T2 and on the right at T10-11 secondary to facet arthropathy.     Electronically Signed   By: Clem Savory M.D.   On: 10/06/2023 11:08  L spine Xrays 08/02/2023 IMPRESSION: Advanced degenerative disc disease with narrowing of the L2-3 disc space. With a large bridging syndesmophytes  osteophytes. Significant anterior osteophytic changes and posterior bony spondylosis. Narrowing of the L4-5 disc space. Advanced degenerative facet disease L4-5 and L5-S1.     Electronically Signed   By: Franky Chard M.D.   On: 08/16/2023 19:52  Please note that he also has retrolisthesis at L2-3.   I have personally reviewed the images and agree with the above interpretation.  Assessment and Plan: Mr. Pacha is a pleasant 68 y.o. male with retrolisthesis, back pain with bilateral sciatica, and neurogenic claudication.  Spinal stenosis with neurogenic claudication Chronic spinal stenosis with neurogenic claudication, MRI shows compression at L2-L3, L3-L4, and L4-L5. Conservative treatments failed, surgery required. - Perform L2-L3 fusion, decompression at L3-L4 and L4-L5. - Discussed surgical risks: bleeding, infection, major events. - Explained 80% chance of improvement. - Informed recovery takes months, most improve by six weeks. - Schedule surgery post-vacation, options in August.  I recommended L2-3 XLIF/PSF with L3-5 decompression.  Fusion is required due to back pain, retrolisthesis, and severe disc height loss and degeneration at L2/3.  I discussed the planned procedure at length with the patient, including the risks, benefits, alternatives, and indications. The risks discussed include but are not limited to bleeding, infection, need for reoperation, spinal fluid leak, stroke, vision loss, anesthetic complication, coma, paralysis, and even death. I also described the possibility of psoas weakness and paresthesias. I described in detail that improvement was not guaranteed.  The patient expressed understanding of these risks, and asked that we proceed with surgery. I described the surgery in layman's terms, and gave ample opportunity for questions, which were answered to the best of my ability.  I spent a total of 30 minutes in this patient's care today. This time was spent  reviewing pertinent records including imaging studies, obtaining and confirming history, performing a directed evaluation, formulating and discussing my recommendations, and documenting the visit within the medical record.      Thank you for involving me in the care of this patient.      Nilesh Stegall K. Clois MD, [facility]

## 2023-12-12 ENCOUNTER — Encounter: Payer: Self-pay | Admitting: Neurosurgery

## 2023-12-12 ENCOUNTER — Ambulatory Visit: Admitting: Neurosurgery

## 2023-12-12 ENCOUNTER — Other Ambulatory Visit: Payer: Self-pay

## 2023-12-12 VITALS — BP 138/84 | Ht 72.0 in | Wt 248.0 lb

## 2023-12-12 DIAGNOSIS — M4310 Spondylolisthesis, site unspecified: Secondary | ICD-10-CM

## 2023-12-12 DIAGNOSIS — M4316 Spondylolisthesis, lumbar region: Secondary | ICD-10-CM

## 2023-12-12 DIAGNOSIS — Z01818 Encounter for other preprocedural examination: Secondary | ICD-10-CM

## 2023-12-12 DIAGNOSIS — M5442 Lumbago with sciatica, left side: Secondary | ICD-10-CM

## 2023-12-12 DIAGNOSIS — M5441 Lumbago with sciatica, right side: Secondary | ICD-10-CM

## 2023-12-12 DIAGNOSIS — G8929 Other chronic pain: Secondary | ICD-10-CM

## 2023-12-12 DIAGNOSIS — G629 Polyneuropathy, unspecified: Secondary | ICD-10-CM

## 2023-12-12 DIAGNOSIS — M48062 Spinal stenosis, lumbar region with neurogenic claudication: Secondary | ICD-10-CM

## 2023-12-12 MED ORDER — GABAPENTIN 300 MG PO CAPS: ORAL_CAPSULE | ORAL | 3 refills | Status: DC

## 2023-12-12 NOTE — Patient Instructions (Signed)
 Please see below for information in regards to your upcoming surgery:   Planned surgery: L2-3 lateral lumbar interbody fusion and posterior spinal fusion; L3-5 posterior spinal decompression   Surgery date: 01/03/24 at [facility] (Medical Mall: [address] [place], [place] [address]) - you will find out your arrival time the business day before your surgery.   Pre-op appointment at [facility] Pre-admit Testing: you will receive a call with a date/time for this appointment. If you are scheduled for an in person appointment, Pre-admit Testing is located on the first floor of the [facility] building, [address], [address]. During this appointment, they will advise you which medications you can take the morning of surgery, and which medications you will need to hold for surgery. Labs (such as blood work, EKG) Marcella be done at your pre-op appointment. You are not required to fast for these labs. Should you need to change your pre-op appointment, please call Pre-admit testing at [phone].     Surgical clearance: we will send a clearance form to Dr Glover. They Ishaq wish to see you in their office prior to signing the clearance form. If so, they [place]inchliffe call you to schedule an appointment.     NSAIDS (Non-steroidal anti-inflammatory drugs): because you are having a fusion, please avoid taking any NSAIDS (examples: indomethacin , ibuprofen, motrin, aleve, naproxen, meloxicam , diclofenac ) for 3 months after surgery. Celebrex is an exception and is OK to take, if prescribed. Tylenol  is not an NSAID.    Common restrictions after surgery: No bending, lifting, or twisting ("BLT"). Avoid lifting objects heavier than 10 pounds for the first 6 weeks after surgery. Where possible, avoid household activities that involve lifting, bending, reaching, pushing, or pulling such as laundry, vacuuming, grocery shopping, and childcare. Try to arrange for help from friends  and family for these activities while you heal. Do not drive while taking prescription pain medication. Weeks 6 through 12 after surgery: avoid lifting more than 25 pounds.    X-rays after surgery: Because you are having a fusion: for appointments after your 2 week follow-up: please arrive at the [facility] outpatient imaging center ([address], [place]) or [facility] one hour prior to your appointment for x-rays. This applies to every appointment after your 2 week follow-up. Failure to do so Watrous result in your appointment being rescheduled.   How to contact us :  If you have any questions/concerns before or after surgery, you can reach us  at [phone], or you can send a mychart message. We can be reached by phone or mychart 8am-4pm, Monday-Friday.  *Please note: Calls after 4pm are forwarded to a third party answering service. Mychart messages are not routinely monitored during evenings, weekends, and holidays. Please call our office to contact the answering service for urgent concerns during non-business hours.   If you have FMLA/disability paperwork, please drop it off or fax it to [phone]   Appointments/FMLA & disability paperwork: Reche & Ritta Registered Nurse/Surgery scheduler: Thamas Appleyard, RN Certified Medical Assistants: Don, CMA, Elenor, CMA, & Damien, CMA Physician Assistants: Lyle Decamp, PA-C, Edsel Goods, PA-C & Glade Boys, PA-C Surgeons: Penne Sharps, MD & Reeves Daisy, MD   [facility] PREADMIT TESTING VISIT and SURGERY INFORMATION SHEET   Now that surgery has been scheduled you can anticipate several phone calls from [facility] services. A pharmacy technician will call you to verify your current list of medications taken at home.  The Pre-Service Center will call to verify your insurance information and to give you billing estimates and information.             The Preadmit  Testing Office will be calling to schedule a visit to obtain information for the anesthesia team and provide instructions on preparation for surgery.  What can you expect for the Preadmit Testing Visit: Appointments Meas be scheduled in-person or by telephone.  If a telephone visit is scheduled, you Ehrler be asked to come into the office to have lab tests or other studies performed.   This visit will not be completed any greater than 14 days prior to your surgery.  If your surgery has been scheduled for a future date, please do not be alarmed if we have not contacted you to schedule an appointment more than a month prior to the surgery date.    Please be prepared to provide the following information during this appointment:            -Personal medical history                                               -Medication and allergy list            -Any history of problems with anesthesia              -Recent lab work or diagnostic studies            -Please notify us  of any needs we should be aware of to provide the best care possible           -You will be provided with instructions on how to prepare for your surgery.    On The Day of Surgery:  You must have a driver to take you home after surgery, you will be asked not to drive for 24 hours following surgery.  Taxi, Gisele and non-medical transport will not be acceptable means of transportation unless you have a responsible individual who will be traveling with you.  Visitors in the surgical area:   2 people will be able to visit you in your room once your preparation for surgery has been completed. During surgery, your visitors will be asked to wait in the Surgery Waiting Area.  It is not a requirement for them to stay, if they prefer to leave and come back.  Your visitor(s) will be given an update once the surgery has been completed.  No visitors are allowed in the initial recovery room to respect patient privacy and safety.  Once you are more  awake and transfer to the secondary recovery area, or are transferred to an inpatient room, visitors will again be able to see you.  To respect and protect your privacy: We will ask on the day of surgery who your driver will be and what the contact number for that individual will be. We will ask if it is okay to share information with this individual, or if there is an alternative individual that we, or the surgeon, should contact to provide updates and information. If family or friends come to the surgical information desk requesting information about you, who you have not listed with us , no information will be given.   It Berhow be helpful to designate someone as the main contact who will be responsible for updating your other friends  and family.    PREADMIT TESTING OFFICE: [phone] SAME DAY SURGERY: [phone] We look forward to caring for you before and throughout the process of your surgery.

## 2023-12-26 ENCOUNTER — Other Ambulatory Visit: Payer: Self-pay

## 2023-12-26 ENCOUNTER — Encounter
Admission: RE | Admit: 2023-12-26 | Discharge: 2023-12-26 | Disposition: A | Discharge status: Home / Self Care | Source: Ambulatory Visit | Attending: Neurosurgery | Admitting: Neurosurgery

## 2023-12-26 VITALS — BP 132/82 | HR 70 | Resp 16

## 2023-12-26 DIAGNOSIS — I10 Essential (primary) hypertension: Secondary | ICD-10-CM | POA: Insufficient documentation

## 2023-12-26 DIAGNOSIS — R7303 Prediabetes: Secondary | ICD-10-CM | POA: Diagnosis not present

## 2023-12-26 DIAGNOSIS — Z01818 Encounter for other preprocedural examination: Secondary | ICD-10-CM | POA: Insufficient documentation

## 2023-12-26 DIAGNOSIS — Z01812 Encounter for preprocedural laboratory examination: Secondary | ICD-10-CM

## 2023-12-26 HISTORY — DX: Prediabetes: R73.03

## 2023-12-26 HISTORY — DX: Anxiety disorder, unspecified: F41.9

## 2023-12-26 HISTORY — DX: Personal history of urinary calculi: Z87.442

## 2023-12-26 HISTORY — DX: COVID-19: U07.1

## 2023-12-26 LAB — SURGICAL PCR SCREEN
MRSA, PCR: NEGATIVE
Staphylococcus aureus: NEGATIVE

## 2023-12-26 LAB — TYPE AND SCREEN
ABO/RH(D): B POS
Antibody Screen: NEGATIVE

## 2023-12-26 NOTE — Patient Instructions (Addendum)
 Your procedure is scheduled on: 01/03/24 - Wednesday Report to the Registration Desk on the 1st floor of the [facility]. To find out your arrival time, please call [phone] between 1PM - 3PM on: 01/02/24 - Tuesday If your arrival time is 6:00 am, do not arrive before that time as the [facility] entrance doors do not open until 6:00 am.  REMEMBER: Instructions that are not followed completely Aldana result in serious medical risk, up to and including death; or upon the discretion of your surgeon and anesthesiologist your surgery Yeagle need to be rescheduled.  Do not eat food after midnight the night before surgery.  No gum chewing or hard candies.  You Lollis however, drink CLEAR liquids up to 2 hours before you are scheduled to arrive for your surgery. Do not drink anything within 2 hours of your scheduled arrival time.  Clear liquids include: - water  - apple juice without pulp - gatorade (not RED colors) - black coffee or tea (Do NOT add milk or creamers to the coffee or tea) Do NOT drink anything that is not on this list.   NSAIDS (Non-steroidal anti-inflammatory drugs): because you are having a fusion, please avoid taking any NSAIDS (examples: indomethacin , ibuprofen, motrin, aleve, naproxen, meloxicam , diclofenac ) for 3 months after surgery. Celebrex is an exception and is OK to take, if prescribed. Tylenol  is not an NSAID.   Stop beginning 07/30,  ANY OVER THE COUNTER supplements until after surgery : CENTRUM SILVER , Vitamin D3.  You Loving however, continue to take Tylenol  if needed for pain up until the day of surgery.  HOLD lisinopril -hydrochlorothiazide  on the day of surgery.  ON THE DAY OF SURGERY ONLY TAKE THESE MEDICATIONS WITH SIPS OF WATER:  gabapentin  (NEURONTIN )  omeprazole  (PRILOSEC)  3.   ALPRAZolam  (XANAX )    No Alcohol for 24 hours before or after surgery.  No Smoking including e-cigarettes for 24 hours before surgery.  No chewable tobacco products for at  least 6 hours before surgery.  No nicotine patches on the day of surgery.  Do not use any recreational drugs for at least a week (preferably 2 weeks) before your surgery.  Please be advised that the combination of cocaine and anesthesia Cardy have negative outcomes, up to and including death. If you test positive for cocaine, your surgery will be cancelled.  On the morning of surgery brush your teeth with toothpaste and water, you Panico rinse your mouth with mouthwash if you wish. Do not swallow any toothpaste or mouthwash.  Use CHG Soap or wipes as directed on instruction sheet.  Do not wear jewelry, make-up, hairpins, clips or nail polish.  For welded (permanent) jewelry: bracelets, anklets, waist bands, etc.  Please have this removed prior to surgery.  If it is not removed, there is a chance that hospital personnel will need to cut it off on the day of surgery.  Do not wear lotions, powders, or perfumes.   Do not shave body hair from the neck down 48 hours before surgery.  Contact lenses, hearing aids and dentures Mauceri not be worn into surgery.  Do not bring valuables to the hospital. [facility] is not responsible for any missing/lost belongings or valuables.   Notify your doctor if there is any change in your medical condition (cold, fever, infection).  Wear comfortable clothing (specific to your surgery type) to the hospital.  After surgery, you can help prevent lung complications by doing breathing exercises.  Take deep breaths and cough  every 1-2 hours. Your doctor Garfinkel order a device called an Incentive Spirometer to help you take deep breaths.  When coughing or sneezing, hold a pillow firmly against your incision with both hands. This is called "splinting." Doing this helps protect your incision. It also decreases belly discomfort.  If you are being admitted to the hospital overnight, leave your suitcase in the car. After surgery it Teeple be brought to your room.  In case of  increased patient census, it Pepper be necessary for you, the patient, to continue your postoperative care in the Same Day Surgery department.  If you are being discharged the day of surgery, you will not be allowed to drive home. You will need a responsible individual to drive you home and stay with you for 24 hours after surgery.   If you are taking public transportation, you will need to have a responsible individual with you.  Please call the Pre-admissions Testing Dept. at [phone] if you have any questions about these instructions.  Surgery Visitation Policy:  Patients having surgery or a procedure Dorow have two visitors.  Children under the age of 43 must have an adult with them who is not the patient.  Inpatient Visitation:    Visiting hours are 7 a.m. to 8 p.m. Up to four visitors are allowed at one time in a patient room. The visitors Payson rotate out with other people during the day.  One visitor age 61 or older Gancarz stay with the patient overnight and must be in the room by 8 p.m.   Merchandiser, retail to address health-related social needs:  [email or web address]    Pre-operative 5 CHG Bath Instructions   You can play a key role in reducing the risk of infection after surgery. Your skin needs to be as free of germs as possible. You can reduce the number of germs on your skin by washing with CHG (chlorhexidine gluconate) soap before surgery. CHG is an antiseptic soap that kills germs and continues to kill germs even after washing.   DO NOT use if you have an allergy to chlorhexidine/CHG or antibacterial soaps. If your skin becomes reddened or irritated, stop using the CHG and notify one of our RNs at [phone].   Please shower with the CHG soap starting 4 days before surgery using the following schedule: 08/02 - 08/06.    Please keep in mind the following:  DO NOT shave, including legs and underarms, starting the day of your first shower.   You  Khokhar shave your face at any point before/day of surgery.  Place clean sheets on your bed the day you start using CHG soap. Use a clean washcloth (not used since being washed) for each shower. DO NOT sleep with pets once you start using the CHG.   CHG Shower Instructions:  If you choose to wash your hair and private area, wash first with your normal shampoo/soap.  After you use shampoo/soap, rinse your hair and body thoroughly to remove shampoo/soap residue.  Turn the water OFF and apply about 3 tablespoons (45 ml) of CHG soap to a CLEAN washcloth.  Apply CHG soap ONLY FROM YOUR NECK DOWN TO YOUR TOES (washing for 3-5 minutes)  DO NOT use CHG soap on face, private areas, open wounds, or sores.  Pay special attention to the area where your surgery is being performed.  If you are having back surgery, having someone wash your back for you Steedman be helpful. Wait 2 minutes after CHG soap  is applied, then you Fraticelli rinse off the CHG soap.  Pat dry with a clean towel  Put on clean clothes/pajamas   If you choose to wear lotion, please use ONLY the CHG-compatible lotions on the back of this paper.     Additional instructions for the day of surgery: DO NOT APPLY any lotions, deodorants, cologne, or perfumes.   Put on clean/comfortable clothes.  Brush your teeth.  Ask your nurse before applying any prescription medications to the skin.      CHG Compatible Lotions   Aveeno Moisturizing lotion  Cetaphil Moisturizing Cream  Cetaphil Moisturizing Lotion  Clairol Herbal Essence Moisturizing Lotion, Dry Skin  Clairol Herbal Essence Moisturizing Lotion, Extra Dry Skin  Clairol Herbal Essence Moisturizing Lotion, Normal Skin  Curel Age Defying Therapeutic Moisturizing Lotion with Alpha Hydroxy  Curel Extreme Care Body Lotion  Curel Soothing Hands Moisturizing Hand Lotion  Curel Therapeutic Moisturizing Cream, Fragrance-Free  Curel Therapeutic Moisturizing Lotion, Fragrance-Free  Curel Therapeutic  Moisturizing Lotion, Original Formula  Eucerin Daily Replenishing Lotion  Eucerin Dry Skin Therapy Plus Alpha Hydroxy Crme  Eucerin Dry Skin Therapy Plus Alpha Hydroxy Lotion  Eucerin Original Crme  Eucerin Original Lotion  Eucerin Plus Crme Eucerin Plus Lotion  Eucerin TriLipid Replenishing Lotion  Keri Anti-Bacterial Hand Lotion  Keri Deep Conditioning Original Lotion Dry Skin Formula Softly Scented  Keri Deep Conditioning Original Lotion, Fragrance Free Sensitive Skin Formula  Keri Lotion Fast Absorbing Fragrance Free Sensitive Skin Formula  Keri Lotion Fast Absorbing Softly Scented Dry Skin Formula  Keri Original Lotion  Keri Skin Renewal Lotion Keri Silky Smooth Lotion  Keri Silky Smooth Sensitive Skin Lotion  Nivea Body Creamy Conditioning Oil  Nivea Body Extra Enriched Teacher, adult education Moisturizing Lotion Nivea Crme  Nivea Skin Firming Lotion  NutraDerm 30 Skin Lotion  NutraDerm Skin Lotion  NutraDerm Therapeutic Skin Cream  NutraDerm Therapeutic Skin Lotion  ProShield Protective Hand Cream  Provon moisturizing lotion

## 2024-01-03 ENCOUNTER — Inpatient Hospital Stay

## 2024-01-03 ENCOUNTER — Inpatient Hospital Stay: Payer: Self-pay | Admitting: Urgent Care

## 2024-01-03 ENCOUNTER — Inpatient Hospital Stay
Admission: RE | Admit: 2024-01-03 | Discharge: 2024-01-04 | DRG: 402 | Disposition: A | Discharge status: Home / Self Care | Attending: Neurosurgery | Admitting: Neurosurgery

## 2024-01-03 ENCOUNTER — Other Ambulatory Visit: Payer: Self-pay

## 2024-01-03 ENCOUNTER — Encounter: Payer: Self-pay | Admitting: Neurosurgery

## 2024-01-03 ENCOUNTER — Inpatient Hospital Stay: Admitting: Anesthesiology

## 2024-01-03 ENCOUNTER — Encounter
Admission: RE | Disposition: A | Discharge status: Home / Self Care | Payer: Self-pay | Source: Home / Self Care | Attending: Neurosurgery

## 2024-01-03 DIAGNOSIS — M48062 Spinal stenosis, lumbar region with neurogenic claudication: Secondary | ICD-10-CM | POA: Diagnosis present

## 2024-01-03 DIAGNOSIS — M5442 Lumbago with sciatica, left side: Secondary | ICD-10-CM | POA: Diagnosis present

## 2024-01-03 DIAGNOSIS — M1990 Unspecified osteoarthritis, unspecified site: Secondary | ICD-10-CM | POA: Diagnosis present

## 2024-01-03 DIAGNOSIS — M109 Gout, unspecified: Secondary | ICD-10-CM | POA: Diagnosis present

## 2024-01-03 DIAGNOSIS — I10 Essential (primary) hypertension: Secondary | ICD-10-CM | POA: Diagnosis present

## 2024-01-03 DIAGNOSIS — E785 Hyperlipidemia, unspecified: Secondary | ICD-10-CM | POA: Diagnosis present

## 2024-01-03 DIAGNOSIS — Z981 Arthrodesis status: Principal | ICD-10-CM

## 2024-01-03 DIAGNOSIS — Z79899 Other long term (current) drug therapy: Secondary | ICD-10-CM | POA: Diagnosis not present

## 2024-01-03 DIAGNOSIS — K219 Gastro-esophageal reflux disease without esophagitis: Secondary | ICD-10-CM | POA: Diagnosis present

## 2024-01-03 DIAGNOSIS — M4310 Spondylolisthesis, site unspecified: Secondary | ICD-10-CM

## 2024-01-03 DIAGNOSIS — M5441 Lumbago with sciatica, right side: Secondary | ICD-10-CM | POA: Diagnosis present

## 2024-01-03 DIAGNOSIS — Z8249 Family history of ischemic heart disease and other diseases of the circulatory system: Secondary | ICD-10-CM | POA: Diagnosis not present

## 2024-01-03 DIAGNOSIS — Z87891 Personal history of nicotine dependence: Secondary | ICD-10-CM

## 2024-01-03 DIAGNOSIS — Z87442 Personal history of urinary calculi: Secondary | ICD-10-CM | POA: Diagnosis not present

## 2024-01-03 DIAGNOSIS — G8929 Other chronic pain: Secondary | ICD-10-CM | POA: Diagnosis present

## 2024-01-03 DIAGNOSIS — Z96652 Presence of left artificial knee joint: Secondary | ICD-10-CM | POA: Diagnosis present

## 2024-01-03 DIAGNOSIS — Z833 Family history of diabetes mellitus: Secondary | ICD-10-CM | POA: Diagnosis not present

## 2024-01-03 DIAGNOSIS — Z01818 Encounter for other preprocedural examination: Secondary | ICD-10-CM

## 2024-01-03 HISTORY — PX: ANTERIOR LATERAL LUMBAR FUSION WITH PERCUTANEOUS SCREW 1 LEVEL: SHX5553

## 2024-01-03 HISTORY — PX: APPLICATION OF INTRAOPERATIVE CT SCAN: SHX6668

## 2024-01-03 HISTORY — PX: LUMBAR LAMINECTOMY/DECOMPRESSION MICRODISCECTOMY: SHX5026

## 2024-01-03 LAB — ABO/RH: ABO/RH(D): B POS

## 2024-01-03 LAB — GLUCOSE, CAPILLARY: Glucose-Capillary: 151 mg/dL — ABNORMAL HIGH (ref 70–99)

## 2024-01-03 SURGERY — ANTERIOR LATERAL LUMBAR FUSION WITH PERCUTANEOUS SCREW 1 LEVEL
Anesthesia: General

## 2024-01-03 MED ORDER — ONDANSETRON HCL 4 MG PO TABS: 4.0000 mg | ORAL_TABLET | Freq: Four times a day (QID) | ORAL | Status: DC | PRN

## 2024-01-03 MED ORDER — HYDROMORPHONE HCL 1 MG/ML IJ SOLN
INTRAMUSCULAR | Status: AC
  Filled 2024-01-03: qty 1

## 2024-01-03 MED ORDER — PHENYLEPHRINE HCL-NACL 20-0.9 MG/250ML-% IV SOLN
INTRAVENOUS | Status: DC | PRN
  Administered 2024-01-03: 40 ug/min via INTRAVENOUS

## 2024-01-03 MED ORDER — MIDAZOLAM HCL 2 MG/2ML IJ SOLN
INTRAMUSCULAR | Status: AC
  Filled 2024-01-03: qty 2

## 2024-01-03 MED ORDER — POLYETHYLENE GLYCOL 3350 17 G PO PACK: 17.0000 g | PACK | Freq: Every day | ORAL | Status: DC | PRN

## 2024-01-03 MED ORDER — LACTATED RINGERS IV SOLN: INTRAVENOUS | Status: DC

## 2024-01-03 MED ORDER — DEXAMETHASONE SODIUM PHOSPHATE 10 MG/ML IJ SOLN
INTRAMUSCULAR | Status: DC | PRN
  Administered 2024-01-03: 10 mg via INTRAVENOUS

## 2024-01-03 MED ORDER — REMIFENTANIL HCL 1 MG IV SOLR
INTRAVENOUS | Status: DC | PRN
  Administered 2024-01-03: .05 ug/kg/min via INTRAVENOUS

## 2024-01-03 MED ORDER — PROPOFOL 1000 MG/100ML IV EMUL
INTRAVENOUS | Status: AC
  Filled 2024-01-03: qty 100

## 2024-01-03 MED ORDER — DEXMEDETOMIDINE HCL IN NACL 80 MCG/20ML IV SOLN
INTRAVENOUS | Status: DC | PRN
  Administered 2024-01-03: 8 ug via INTRAVENOUS
  Administered 2024-01-03: 4 ug via INTRAVENOUS

## 2024-01-03 MED ORDER — VANCOMYCIN HCL IN DEXTROSE 1-5 GM/200ML-% IV SOLN
INTRAVENOUS | Status: AC
Start: 2024-01-03 — End: 2024-01-03
  Filled 2024-01-03: qty 200

## 2024-01-03 MED ORDER — SORBITOL 70 % SOLN: 30.0000 mL | Freq: Every day | Status: DC | PRN

## 2024-01-03 MED ORDER — KETOROLAC TROMETHAMINE 15 MG/ML IJ SOLN
7.5000 mg | Freq: Four times a day (QID) | INTRAMUSCULAR | Status: AC
  Administered 2024-01-03 – 2024-01-04 (×4): 7.5 mg via INTRAVENOUS
  Filled 2024-01-03 (×3): qty 1

## 2024-01-03 MED ORDER — CELECOXIB 200 MG PO CAPS
ORAL_CAPSULE | ORAL | Status: AC
  Filled 2024-01-03: qty 1

## 2024-01-03 MED ORDER — OXYCODONE HCL 5 MG PO TABS: 5.0000 mg | ORAL_TABLET | ORAL | Status: DC | PRN

## 2024-01-03 MED ORDER — OXYCODONE HCL 5 MG PO TABS
10.0000 mg | ORAL_TABLET | ORAL | Status: DC | PRN
  Administered 2024-01-03 – 2024-01-04 (×4): 10 mg via ORAL
  Filled 2024-01-03 (×3): qty 2

## 2024-01-03 MED ORDER — PHENYLEPHRINE 80 MCG/ML (10ML) SYRINGE FOR IV PUSH (FOR BLOOD PRESSURE SUPPORT): PREFILLED_SYRINGE | INTRAVENOUS | Status: DC | PRN

## 2024-01-03 MED ORDER — CHLORHEXIDINE GLUCONATE 0.12 % MT SOLN
OROMUCOSAL | Status: AC
  Filled 2024-01-03: qty 15

## 2024-01-03 MED ORDER — ALPRAZOLAM 0.25 MG PO TABS
0.2500 mg | ORAL_TABLET | Freq: Two times a day (BID) | ORAL | Status: DC | PRN
  Administered 2024-01-03: 0.25 mg via ORAL
  Filled 2024-01-03: qty 1

## 2024-01-03 MED ORDER — LABETALOL HCL 5 MG/ML IV SOLN
INTRAVENOUS | Status: DC | PRN
  Administered 2024-01-03: 5 mg via INTRAVENOUS

## 2024-01-03 MED ORDER — ACETAMINOPHEN 500 MG PO TABS
1000.0000 mg | ORAL_TABLET | Freq: Four times a day (QID) | ORAL | Status: DC
  Administered 2024-01-03 – 2024-01-04 (×3): 1000 mg via ORAL
  Filled 2024-01-03 (×2): qty 2

## 2024-01-03 MED ORDER — CEFAZOLIN SODIUM-DEXTROSE 2-4 GM/100ML-% IV SOLN
INTRAVENOUS | Status: AC
  Filled 2024-01-03: qty 100

## 2024-01-03 MED ORDER — SUCCINYLCHOLINE CHLORIDE 200 MG/10ML IV SOSY
PREFILLED_SYRINGE | INTRAVENOUS | Status: DC | PRN
  Administered 2024-01-03: 100 mg via INTRAVENOUS

## 2024-01-03 MED ORDER — 0.9 % SODIUM CHLORIDE (POUR BTL) OPTIME
TOPICAL | Status: DC | PRN
  Administered 2024-01-03: 40 mL

## 2024-01-03 MED ORDER — REMIFENTANIL HCL 1 MG IV SOLR
INTRAVENOUS | Status: AC
  Filled 2024-01-03: qty 1000

## 2024-01-03 MED ORDER — HYDROCHLOROTHIAZIDE 25 MG PO TABS
25.0000 mg | ORAL_TABLET | Freq: Every day | ORAL | Status: DC
  Administered 2024-01-03 – 2024-01-04 (×2): 25 mg via ORAL
  Filled 2024-01-03 (×2): qty 1

## 2024-01-03 MED ORDER — LISINOPRIL 20 MG PO TABS
ORAL_TABLET | ORAL | Status: AC
  Filled 2024-01-03: qty 1

## 2024-01-03 MED ORDER — FENTANYL CITRATE (PF) 100 MCG/2ML IJ SOLN
INTRAMUSCULAR | Status: AC
  Filled 2024-01-03: qty 2

## 2024-01-03 MED ORDER — SODIUM CHLORIDE 0.9 % IV SOLN: 250.0000 mL | INTRAVENOUS | Status: DC

## 2024-01-03 MED ORDER — ACETAMINOPHEN 500 MG PO TABS
1000.0000 mg | ORAL_TABLET | Freq: Once | ORAL | Status: AC
  Administered 2024-01-03: 1000 mg via ORAL

## 2024-01-03 MED ORDER — GABAPENTIN 300 MG PO CAPS
ORAL_CAPSULE | ORAL | Status: AC
  Filled 2024-01-03: qty 1

## 2024-01-03 MED ORDER — GABAPENTIN 100 MG PO CAPS
300.0000 mg | ORAL_CAPSULE | Freq: Every day | ORAL | Status: DC
  Administered 2024-01-04: 300 mg via ORAL
  Filled 2024-01-03: qty 3

## 2024-01-03 MED ORDER — ATORVASTATIN CALCIUM 80 MG PO TABS
80.0000 mg | ORAL_TABLET | Freq: Every day | ORAL | Status: DC
  Administered 2024-01-03 – 2024-01-04 (×2): 80 mg via ORAL
  Filled 2024-01-03: qty 1

## 2024-01-03 MED ORDER — ENOXAPARIN SODIUM 40 MG/0.4ML IJ SOSY
40.0000 mg | PREFILLED_SYRINGE | INTRAMUSCULAR | Status: DC
  Administered 2024-01-04: 40 mg via SUBCUTANEOUS
  Filled 2024-01-03: qty 0.4

## 2024-01-03 MED ORDER — ACETAMINOPHEN 325 MG PO TABS: 650.0000 mg | ORAL_TABLET | ORAL | Status: DC | PRN

## 2024-01-03 MED ORDER — ACETAMINOPHEN 500 MG PO TABS
ORAL_TABLET | ORAL | Status: AC
  Filled 2024-01-03: qty 2

## 2024-01-03 MED ORDER — ATORVASTATIN CALCIUM 20 MG PO TABS
ORAL_TABLET | ORAL | Status: AC
  Filled 2024-01-03: qty 4

## 2024-01-03 MED ORDER — SURGIFLO WITH THROMBIN (HEMOSTATIC MATRIX KIT) OPTIME
TOPICAL | Status: DC | PRN
  Administered 2024-01-03: 1 via TOPICAL

## 2024-01-03 MED ORDER — ORAL CARE MOUTH RINSE: 15.0000 mL | Freq: Once | OROMUCOSAL | Status: AC

## 2024-01-03 MED ORDER — OXYCODONE HCL 5 MG/5ML PO SOLN: 5.0000 mg | Freq: Once | ORAL | Status: DC | PRN

## 2024-01-03 MED ORDER — METHOCARBAMOL 500 MG PO TABS
500.0000 mg | ORAL_TABLET | Freq: Four times a day (QID) | ORAL | Status: DC | PRN
  Administered 2024-01-03: 500 mg via ORAL
  Filled 2024-01-03: qty 1

## 2024-01-03 MED ORDER — PANTOPRAZOLE SODIUM 40 MG PO TBEC
40.0000 mg | DELAYED_RELEASE_TABLET | Freq: Every day | ORAL | Status: DC
  Administered 2024-01-04: 40 mg via ORAL
  Filled 2024-01-03: qty 1

## 2024-01-03 MED ORDER — PROPOFOL 10 MG/ML IV BOLUS
INTRAVENOUS | Status: DC | PRN
  Administered 2024-01-03: 200 mg via INTRAVENOUS

## 2024-01-03 MED ORDER — GABAPENTIN 100 MG PO CAPS
300.0000 mg | ORAL_CAPSULE | Freq: Every morning | ORAL | Status: DC
  Administered 2024-01-04: 300 mg via ORAL
  Filled 2024-01-03: qty 3

## 2024-01-03 MED ORDER — GABAPENTIN 300 MG PO CAPS
300.0000 mg | ORAL_CAPSULE | Freq: Once | ORAL | Status: AC
  Administered 2024-01-03: 300 mg via ORAL

## 2024-01-03 MED ORDER — ONDANSETRON HCL 4 MG/2ML IJ SOLN: 4.0000 mg | Freq: Four times a day (QID) | INTRAMUSCULAR | Status: DC | PRN

## 2024-01-03 MED ORDER — PHENYLEPHRINE HCL (PRESSORS) 10 MG/ML IV SOLN
INTRAVENOUS | Status: AC
Start: 2024-01-03 — End: 2024-01-03
  Filled 2024-01-03: qty 1

## 2024-01-03 MED ORDER — ALLOPURINOL 100 MG PO TABS
100.0000 mg | ORAL_TABLET | Freq: Every day | ORAL | Status: DC
  Administered 2024-01-04: 100 mg via ORAL
  Filled 2024-01-03 (×2): qty 1

## 2024-01-03 MED ORDER — BUPIVACAINE-EPINEPHRINE (PF) 0.5% -1:200000 IJ SOLN
INTRAMUSCULAR | Status: AC
  Filled 2024-01-03: qty 10

## 2024-01-03 MED ORDER — PROPOFOL 10 MG/ML IV BOLUS
INTRAVENOUS | Status: AC
  Filled 2024-01-03: qty 20

## 2024-01-03 MED ORDER — GABAPENTIN 600 MG PO TABS: 300.0000 mg | ORAL_TABLET | Freq: Once | ORAL | Status: DC

## 2024-01-03 MED ORDER — METHOCARBAMOL 1000 MG/10ML IJ SOLN: 500.0000 mg | Freq: Four times a day (QID) | INTRAMUSCULAR | Status: DC | PRN

## 2024-01-03 MED ORDER — SODIUM CHLORIDE (PF) 0.9 % IJ SOLN
INTRAMUSCULAR | Status: DC | PRN
  Administered 2024-01-03: 60 mL

## 2024-01-03 MED ORDER — DEXAMETHASONE SODIUM PHOSPHATE 10 MG/ML IJ SOLN
INTRAMUSCULAR | Status: AC
  Filled 2024-01-03: qty 1

## 2024-01-03 MED ORDER — BUPIVACAINE-EPINEPHRINE 0.5% -1:200000 IJ SOLN
INTRAMUSCULAR | Status: DC | PRN
Start: 2024-01-03 — End: 2024-01-03
  Administered 2024-01-03: 4 mL
  Administered 2024-01-03: 16 mL

## 2024-01-03 MED ORDER — LIDOCAINE HCL (CARDIAC) PF 100 MG/5ML IV SOSY
PREFILLED_SYRINGE | INTRAVENOUS | Status: DC | PRN
  Administered 2024-01-03: 100 mg via INTRAVENOUS

## 2024-01-03 MED ORDER — LIDOCAINE HCL (PF) 2 % IJ SOLN
INTRAMUSCULAR | Status: AC
  Filled 2024-01-03: qty 5

## 2024-01-03 MED ORDER — ONDANSETRON HCL 4 MG/2ML IJ SOLN
INTRAMUSCULAR | Status: DC | PRN
  Administered 2024-01-03: 4 mg via INTRAVENOUS

## 2024-01-03 MED ORDER — CHLORHEXIDINE GLUCONATE 0.12 % MT SOLN
15.0000 mL | Freq: Once | OROMUCOSAL | Status: AC
  Administered 2024-01-03: 15 mL via OROMUCOSAL

## 2024-01-03 MED ORDER — ZOLPIDEM TARTRATE 5 MG PO TABS: 5.0000 mg | ORAL_TABLET | Freq: Every evening | ORAL | Status: DC | PRN

## 2024-01-03 MED ORDER — CELECOXIB 200 MG PO CAPS
200.0000 mg | ORAL_CAPSULE | Freq: Once | ORAL | Status: AC
  Administered 2024-01-03: 200 mg via ORAL

## 2024-01-03 MED ORDER — PHENYLEPHRINE HCL (PRESSORS) 10 MG/ML IV SOLN
INTRAVENOUS | Status: AC
  Filled 2024-01-03: qty 1

## 2024-01-03 MED ORDER — FENTANYL CITRATE (PF) 100 MCG/2ML IJ SOLN
INTRAMUSCULAR | Status: AC
Start: 2024-01-03 — End: 2024-01-03
  Filled 2024-01-03: qty 2

## 2024-01-03 MED ORDER — MENTHOL 3 MG MT LOZG: 1.0000 | LOZENGE | OROMUCOSAL | Status: DC | PRN

## 2024-01-03 MED ORDER — DOCUSATE SODIUM 100 MG PO CAPS
ORAL_CAPSULE | ORAL | Status: AC
Start: 2024-01-03 — End: 2024-01-03
  Filled 2024-01-03: qty 1

## 2024-01-03 MED ORDER — OXYCODONE HCL 5 MG PO TABS: 5.0000 mg | ORAL_TABLET | Freq: Once | ORAL | Status: DC | PRN

## 2024-01-03 MED ORDER — HYDROMORPHONE HCL 1 MG/ML IJ SOLN
INTRAMUSCULAR | Status: DC | PRN
  Administered 2024-01-03 (×2): .5 mg via INTRAVENOUS

## 2024-01-03 MED ORDER — MIDAZOLAM HCL 2 MG/2ML IJ SOLN
INTRAMUSCULAR | Status: DC | PRN
Start: 2024-01-03 — End: 2024-01-03
  Administered 2024-01-03: 2 mg via INTRAVENOUS

## 2024-01-03 MED ORDER — GABAPENTIN 400 MG PO CAPS
600.0000 mg | ORAL_CAPSULE | Freq: Every day | ORAL | Status: DC
  Administered 2024-01-03: 600 mg via ORAL
  Filled 2024-01-03: qty 2

## 2024-01-03 MED ORDER — MAGNESIUM CITRATE PO SOLN: 1.0000 | Freq: Once | ORAL | Status: DC | PRN

## 2024-01-03 MED ORDER — SENNA 8.6 MG PO TABS
1.0000 | ORAL_TABLET | Freq: Two times a day (BID) | ORAL | Status: DC
  Administered 2024-01-03 – 2024-01-04 (×2): 8.6 mg via ORAL
  Filled 2024-01-03 (×2): qty 1

## 2024-01-03 MED ORDER — REMIFENTANIL HCL 1 MG IV SOLR
INTRAVENOUS | Status: AC
Start: 2024-01-03 — End: 2024-01-03
  Filled 2024-01-03: qty 1000

## 2024-01-03 MED ORDER — PHENOL 1.4 % MT LIQD: 1.0000 | OROMUCOSAL | Status: DC | PRN

## 2024-01-03 MED ORDER — FENTANYL CITRATE (PF) 100 MCG/2ML IJ SOLN
INTRAMUSCULAR | Status: DC | PRN
  Administered 2024-01-03: 100 ug via INTRAVENOUS

## 2024-01-03 MED ORDER — OXYCODONE HCL 5 MG PO TABS
ORAL_TABLET | ORAL | Status: AC
  Filled 2024-01-03: qty 2

## 2024-01-03 MED ORDER — SODIUM CHLORIDE 0.9% FLUSH: 3.0000 mL | INTRAVENOUS | Status: DC | PRN

## 2024-01-03 MED ORDER — DOCUSATE SODIUM 100 MG PO CAPS
100.0000 mg | ORAL_CAPSULE | Freq: Two times a day (BID) | ORAL | Status: DC
  Administered 2024-01-03 – 2024-01-04 (×2): 100 mg via ORAL
  Filled 2024-01-03: qty 1

## 2024-01-03 MED ORDER — ONDANSETRON HCL 4 MG/2ML IJ SOLN
INTRAMUSCULAR | Status: AC
  Filled 2024-01-03: qty 2

## 2024-01-03 MED ORDER — SODIUM CHLORIDE 0.9% FLUSH
3.0000 mL | Freq: Two times a day (BID) | INTRAVENOUS | Status: DC
  Administered 2024-01-03 – 2024-01-04 (×2): 3 mL via INTRAVENOUS

## 2024-01-03 MED ORDER — REMIFENTANIL HCL 1 MG IV SOLR: INTRAVENOUS | Status: DC | PRN

## 2024-01-03 MED ORDER — VANCOMYCIN HCL IN DEXTROSE 1-5 GM/200ML-% IV SOLN
1000.0000 mg | Freq: Once | INTRAVENOUS | Status: AC
  Administered 2024-01-03: 1000 mg via INTRAVENOUS

## 2024-01-03 MED ORDER — CEFAZOLIN IN SODIUM CHLORIDE 2-0.9 GM/100ML-% IV SOLN
2.0000 g | Freq: Once | INTRAVENOUS | Status: AC
  Administered 2024-01-03 (×2): 2 g via INTRAVENOUS

## 2024-01-03 MED ORDER — LISINOPRIL-HYDROCHLOROTHIAZIDE 20-25 MG PO TABS: 1.0000 | ORAL_TABLET | Freq: Every day | ORAL | Status: DC

## 2024-01-03 MED ORDER — PROPOFOL 500 MG/50ML IV EMUL
INTRAVENOUS | Status: DC | PRN
  Administered 2024-01-03: 150 ug/kg/min via INTRAVENOUS
  Administered 2024-01-03: 100 mg via INTRAVENOUS

## 2024-01-03 MED ORDER — KETOROLAC TROMETHAMINE 15 MG/ML IJ SOLN
INTRAMUSCULAR | Status: AC
  Filled 2024-01-03: qty 1

## 2024-01-03 MED ORDER — FENTANYL CITRATE (PF) 100 MCG/2ML IJ SOLN
25.0000 ug | INTRAMUSCULAR | Status: DC | PRN
  Administered 2024-01-03 (×3): 50 ug via INTRAVENOUS

## 2024-01-03 MED ORDER — LISINOPRIL 20 MG PO TABS
20.0000 mg | ORAL_TABLET | Freq: Every day | ORAL | Status: DC
  Administered 2024-01-03 – 2024-01-04 (×2): 20 mg via ORAL

## 2024-01-03 MED ORDER — HYDROMORPHONE HCL 1 MG/ML IJ SOLN
0.5000 mg | INTRAMUSCULAR | Status: AC | PRN
  Administered 2024-01-03: 0.5 mg via INTRAVENOUS

## 2024-01-03 MED ORDER — ACETAMINOPHEN 650 MG RE SUPP: 650.0000 mg | RECTAL | Status: DC | PRN

## 2024-01-03 SURGICAL SUPPLY — 71 items
ALLOGRAFT BONESTRIP KORE 2.5X5 (Bone Implant) IMPLANT
BASIN KIT SINGLE STR (MISCELLANEOUS) ×1 IMPLANT
BUR NEURO DRILL SOFT 3.0X3.8M (BURR) ×1 IMPLANT
CHLORAPREP W/TINT 26 (MISCELLANEOUS) ×2 IMPLANT
CORD BIP STRL DISP 12FT (MISCELLANEOUS) ×1 IMPLANT
CORD LIGHT LATERIAL X LIFT (MISCELLANEOUS) IMPLANT
DERMABOND ADVANCED .7 DNX12 (GAUZE/BANDAGES/DRESSINGS) ×3 IMPLANT
DRAPE C ARM PK CFD 31 SPINE (DRAPES) ×1 IMPLANT
DRAPE C-ARMOR (DRAPES) IMPLANT
DRAPE HD 5FT BACK TABLE (DRAPES) ×1 IMPLANT
DRAPE INCISE IOBAN 66X45 STRL (DRAPES) ×1 IMPLANT
DRAPE LAPAROTOMY 100X77 ABD (DRAPES) ×2 IMPLANT
DRAPE MICROSCOPE SPINE 48X150 (DRAPES) ×1 IMPLANT
DRAPE POUCH INSTRU U-SHP 10X18 (DRAPES) ×1 IMPLANT
DRAPE SCAN PATIENT (DRAPES) ×1 IMPLANT
DRAPE TABLE BACK 80X90 (DRAPES) ×1 IMPLANT
DRSG OPSITE POSTOP 3X4 (GAUZE/BANDAGES/DRESSINGS) ×1 IMPLANT
DRSG OPSITE POSTOP 4X6 (GAUZE/BANDAGES/DRESSINGS) IMPLANT
DRSG OPSITE POSTOP 4X8 (GAUZE/BANDAGES/DRESSINGS) IMPLANT
DRSG TEGADERM 2-3/8X2-3/4 SM (GAUZE/BANDAGES/DRESSINGS) IMPLANT
DRSG TEGADERM 4X4.75 (GAUZE/BANDAGES/DRESSINGS) IMPLANT
DRSG TEGADERM 6X8 (GAUZE/BANDAGES/DRESSINGS) IMPLANT
ELECTRODE EZSTD 165MM 6.5IN (MISCELLANEOUS) ×1 IMPLANT
ELECTRODE REM PT RTRN 9FT ADLT (ELECTROSURGICAL) ×2 IMPLANT
EVACUATOR 1/8 PVC DRAIN (DRAIN) IMPLANT
EX-PIN ORTHOLOCK NAV 4X150 (PIN) ×1 IMPLANT
FEE CVG SUPP BRAINLAB NG SPNE (MISCELLANEOUS) ×1 IMPLANT
FORCEPS BPLR BAYO 10IN 1.0TIP (ORTHOPEDIC DISPOSABLE SUPPLIES) IMPLANT
GAUZE 4X4 16PLY ~~LOC~~+RFID DBL (SPONGE) IMPLANT
GAUZE SPONGE 2X2 STRL 8-PLY (GAUZE/BANDAGES/DRESSINGS) IMPLANT
GLOVE BIOGEL PI IND STRL 6.5 (GLOVE) ×3 IMPLANT
GLOVE SURG SYN 6.5 PF PI (GLOVE) ×5 IMPLANT
GLOVE SURG SYN 8.5 PF PI (GLOVE) ×6 IMPLANT
GOWN SRG LRG LVL 4 IMPRV REINF (GOWNS) ×2 IMPLANT
GOWN SRG XL LVL 3 NONREINFORCE (GOWNS) ×2 IMPLANT
GUIDEWIRE NITINOL BEVEL TIP (WIRE) IMPLANT
HOLDER FOLEY CATH W/STRAP (MISCELLANEOUS) ×1 IMPLANT
KIT DILATOR XLIF 5 (KITS) IMPLANT
KIT DISP MARS 3V (KITS) IMPLANT
KIT SPINAL PRONEVIEW (KITS) ×1 IMPLANT
KIT TURNOVER KIT A (KITS) ×1 IMPLANT
LAVAGE JET IRRISEPT WOUND (IRRIGATION / IRRIGATOR) ×1 IMPLANT
MANIFOLD NEPTUNE II (INSTRUMENTS) ×2 IMPLANT
MARKER SKIN DUAL TIP RULER LAB (MISCELLANEOUS) ×1 IMPLANT
MARKER SPHERE PSV REFLC 13MM (MARKER) ×7 IMPLANT
MODULE NVM5 NEXT GEN EMG (NEUROSURGERY SUPPLIES) IMPLANT
NDL SAFETY ECLIPSE 18X1.5 (NEEDLE) ×1 IMPLANT
NS IRRIG 500ML POUR BTL (IV SOLUTION) ×1 IMPLANT
PACK LAMINECTOMY ARMC (PACKS) ×1 IMPLANT
PAD ARMBOARD POSITIONER FOAM (MISCELLANEOUS) ×1 IMPLANT
PENCIL SMOKE EVACUATOR (MISCELLANEOUS) ×1 IMPLANT
ROD RELINE MAS LORD 5.5X45MM (Rod) IMPLANT
ROD RELINE MAS TI LORD 5.5X40 (Rod) IMPLANT
SCREW LOCK RELINE 5.5 TULIP (Screw) IMPLANT
SCREW RELINE RED 6.5X45MM POLY (Screw) IMPLANT
SPACER HEDRON 10D 11X18X55 (Spacer) IMPLANT
STAPLER SKIN PROX 35W (STAPLE) IMPLANT
SURGIFLO W/THROMBIN 8M KIT (HEMOSTASIS) ×1 IMPLANT
SUT STRATA 3-0 15 PS-2 (SUTURE) ×2 IMPLANT
SUT VIC AB 0 CT1 27XCR 8 STRN (SUTURE) ×2 IMPLANT
SUT VIC AB 2-0 CT1 18 (SUTURE) ×2 IMPLANT
SUTURE EHLN 3-0 FS-10 30 BLK (SUTURE) IMPLANT
SYR 30ML LL (SYRINGE) ×2 IMPLANT
SYR 3ML LL SCALE MARK (SYRINGE) ×1 IMPLANT
TAPE CLOTH 3X10 WHT NS LF (GAUZE/BANDAGES/DRESSINGS) ×4 IMPLANT
TOWEL OR 17X26 4PK STRL BLUE (TOWEL DISPOSABLE) ×2 IMPLANT
TRAP FLUID SMOKE EVACUATOR (MISCELLANEOUS) ×1 IMPLANT
TRAY FOLEY SLVR 16FR LF STAT (SET/KITS/TRAYS/PACK) ×1 IMPLANT
TUBING CONNECTING 10 (TUBING) ×1 IMPLANT
ULTRASOUND BK UROLOGY (MISCELLANEOUS) ×1 IMPLANT
ULTRASOUND BK UROLOGY PROCEDUR (MISCELLANEOUS) IMPLANT

## 2024-01-03 NOTE — Interval H&P Note (Signed)
 History and Physical Interval Note:  01/03/2024 1:11 PM  Jose Moody  has presented today for surgery, with the diagnosis of M43.10 Retrolisthesis M54.42, M54.41, G89.29 Chronic bilateral low back pain with bilateral sciatica M48.062 Lumbar stenosis with neurogenic claudication.  The various methods of treatment have been discussed with the patient and family. After consideration of risks, benefits and other options for treatment, the patient has consented to  Procedure(s) with comments: ANTERIOR LATERAL LUMBAR FUSION WITH PERCUTANEOUS SCREW 1 LEVEL (N/A) - L2-3 LATERAL LUMBAR INTERBODY FUSION AND POSTERIOR SPINAL FUSION LUMBAR LAMINECTOMY/DECOMPRESSION MICRODISCECTOMY 2 LEVELS (N/A) - L3-5 POSTERIOR SPINAL DECOMPRESSION APPLICATION OF INTRAOPERATIVE CT SCAN (N/A) as a surgical intervention.  The patient's history has been reviewed, patient examined, no change in status, stable for surgery.  I have reviewed the patient's chart and labs.  Questions were answered to the patient's satisfaction.    Heart sounds normal no MRG. Chest Clear to Auscultation Bilaterally.   Lisette Mancebo

## 2024-01-03 NOTE — Anesthesia Postprocedure Evaluation (Signed)
 Anesthesia Post Note  Patient: Jose Moody  Procedure(s) Performed: ANTERIOR LATERAL LUMBAR FUSION WITH PERCUTANEOUS SCREW 1 LEVEL LUMBAR LAMINECTOMY/DECOMPRESSION MICRODISCECTOMY 2 LEVELS APPLICATION OF INTRAOPERATIVE CT SCAN  Patient location during evaluation: PACU Anesthesia Type: General Level of consciousness: awake and alert Pain management: pain level controlled Vital Signs Assessment: post-procedure vital signs reviewed and stable Respiratory status: spontaneous breathing, nonlabored ventilation, respiratory function stable and patient connected to nasal cannula oxygen Cardiovascular status: blood pressure returned to baseline and stable Postop Assessment: no apparent nausea or vomiting Anesthetic complications: no   There were no known notable events for this encounter.   Last Vitals:  Vitals:   01/03/24 2033 01/03/24 2201  BP: (!) 150/100 (!) 149/91  Pulse: 88   Resp: 16   Temp: 36.8 C   SpO2: 96%     Last Pain:  Vitals:   01/03/24 2033  TempSrc: Temporal  PainSc:                  Jose Moody

## 2024-01-03 NOTE — Discharge Instructions (Signed)
 Your surgeon has performed an operation on your lumbar spine (low back) to relieve pressure on one or more nerves. Many times, patients feel better immediately after surgery and can "overdo it." Even if you feel well, it is important that you follow these activity guidelines. If you do not let your back heal properly from the surgery, you can increase the chance of hardware complications and/or return of your symptoms. The following are instructions to help in your recovery once you have been discharged from the hospital.  Do not use NSAIDs for 3 months after surgery.  *Regarding compression stockings-  Please wear day and night until you are walking a couple hundred feet three times a day.   Activity    No bending, lifting, or twisting ("BLT"). Avoid lifting objects heavier than 10 pounds (gallon milk jug).  Where possible, avoid household activities that involve lifting, bending, pushing, or pulling such as laundry, vacuuming, grocery shopping, and childcare. Try to arrange for help from friends and family for these activities while your back heals.  Increase physical activity slowly as tolerated.  Taking short walks is encouraged, but avoid strenuous exercise. Do not jog, run, bicycle, lift weights, or participate in any other exercises unless specifically allowed by your doctor. Avoid prolonged sitting, including car rides.  Talk to your doctor before resuming sexual activity.  You should not drive until cleared by your doctor.  Until released by your doctor, you should not return to work or school.  You should rest at home and let your body heal.   You may shower three days after your surgery.  After showering, lightly dab your incision dry. Do not take a tub bath or go swimming for 3 weeks, or until approved by your doctor at your follow-up appointment.  If you smoke, we strongly recommend that you quit.  Smoking has been proven to interfere with normal healing in your back and will  dramatically reduce the success rate of your surgery. Please contact [facility] (800-QUIT-NOW) and use the resources at www.[facility].com for assistance in stopping smoking.  Surgical Incision   If you have a dressing on your incision, you may remove it three days after your surgery. Keep your incision area clean and dry.  Your incision was closed with Dermabond glue. The glue should begin to peel away within about a week.  Diet            You may return to your usual diet. Be sure to stay hydrated.  When to Contact Us   Although your surgery and recovery will likely be uneventful, you may have some residual numbness, aches, and pains in your back and/or legs. This is normal and should improve in the next few weeks.  However, should you experience any of the following, contact us  immediately: New numbness or weakness Pain that is progressively getting worse, and is not relieved by your pain medications or rest Bleeding, redness, swelling, pain, or drainage from surgical incision Chills or flu-like symptoms Fever greater than 101.0 F (38.3 C) Problems with bowel or bladder functions Difficulty breathing or shortness of breath Warmth, tenderness, or swelling in your calf  Contact Information How to contact us :  If you have any questions/concerns before or after surgery, you can reach us  at [phone], or you can send a mychart message. We can be reached by phone or mychart 8am-4pm, Monday-Friday.  *Please note: Calls after 4pm are forwarded to a third party answering service. Mychart messages are not routinely monitored during evenings,  weekends, and holidays. Please call our office to contact the answering service for urgent concerns during non-business hours.

## 2024-01-03 NOTE — Op Note (Signed)
 Indications: [name] is a 68 y.o. male with M43.10 Retrolisthesis, M54.42, M54.41, G89.29 Chronic bilateral low back pain with bilateral sciatica, M48.062 Lumbar stenosis with neurogenic claudication   Findings: expansion of disc space  Preoperative Diagnosis: M43.10 Retrolisthesis, M54.42, M54.41, G89.29 Chronic bilateral low back pain with bilateral sciatica, M48.062 Lumbar stenosis with neurogenic claudication  Postoperative Diagnosis: same   EBL: 100 ml IVF: see anesthesia record Drains: one Disposition: Extubated and Stable to PACU Complications: none  A foley catheter was placed.   Preoperative Note:   Risks of surgery discussed include: infection, bleeding, stroke, coma, death, paralysis, CSF leak, nerve/spinal cord injury, numbness, tingling, weakness, complex regional pain syndrome, recurrent stenosis and/or disc herniation, vascular injury, development of instability, neck/back pain, need for further surgery, persistent symptoms, development of deformity, and the risks of anesthesia. The patient understood these risks and agreed to proceed.  NAME OF ANTERIOR PROCEDURE:               1. Anterior lumbar interbody fusion via a right lateral retroperitoneal approach at L2/3 2. Placement of a Lordotic Globus Hedron interbody cage, filled with Demineralized Bone Matrix  NAME OF POSTERIOR PROCEDURE 1. Posterior instrumentation using Nuvasive Reline Instrumentation 2. Posterolateral fusion, L2/3, utilizing demineralized bone matrix 3. Use of Stereotaxis 4. L3/4 and L4/5 decompression including medial facetectomies and foraminotomies   PROCEDURE:  Patient was brought to the operating room, intubated, turned to the lateral position.  All pressure points were checked and double-checked.  The patient was prepped and draped in the standard fashion. Prior to prepping, fluoroscopy was brought in and the patient was positioned with a large bump under the contralateral side between the  iliac crest and rib cage, allowing the area between the iliac crest and the lateral aspect of the rib cage to open and increase the ability to reach inferiorly, to facilitate entry into the disc space.  The incision was marked upon the skin both the location of the disc space as well as the superior most aspect of the iliac crest.  Based on the identification of the disc space an incision was prepared, marked upon the skin and eventually was used for our lateral incision.  The fluoroscopy was turned into a cross table A/P image in order to confirm that the patient's spine remained in a perpendicular trajectory to the floor without rotation.  Once confirming that all the pressure points were checked and double-checked and the patient remained in sturdy position strapped down in this slightly jack-knifed lateral position, the patient was prepped and draped in standard fashion.  The skin was injected with local anesthetic, then incised until the abdominal wall fascia was noted.  I bluntly dissected posteriorly until we were able to identify the posterior musculature near petit's triangle.  At this point, using primarily blunt dissection with our finger aided with a metzenbaum scissor, were able to enter the retroperitoneal cavity.  The retroperitoneal potential space was opened further until palpating out the psoas muscle, the medial aspect of the iliac crest, the medial aspect of the last rib and continued to define the retroperitoneal space with blunt dissection in order to facilitate safe placement of our dilators.    While protecting by dissecting directly onto a finger in the retroperitoneum, the retroperitoneal space was entered safely from the lateral incision and the initial dilator placed onto the muscle belly of the psoas.  While directly stimulating the dilator and after radiographically confirming our location relative to the disc space, I  placed the dilator through the psoas.  The dilators were  stimulated to ensure remaining safely away from any of the lumbar plexus nerves; the dilators were repositioned until no pathologic stimulation was appreciated.  Once I had confirmed the location of our initial dilator radiographically, a K-wire secured the dilator into the L2/3 disc space and confirmed position under A/P and lateral fluoroscopy.  At this point, I dilated up with direct stimulation to confirm lack of pathologic stimulation.  Once all the dilators were in position, I placed in the retractor and secured it onto the table, locked into position and confirmed under A/P and lateral fluoroscopy to confirm our approach angle to the disc space as well as location relative to the disc space.  I then placed the muscle stimulator in through the working channel down to the vertebral body, stimulating the entire lateral surface of the vertebral body and any of the visualized psoas muscle that was adjacent to the retractor, confirming again the safe passage to the psoas before we began performing the discectomy.  At this point, we began our discectomy at L2/3.  The disc was incised laterally throughout the extent of our exposure. Using a combination of pituitary rongeurs, Kerrison rongeurs, rasps, curettes of various sorts, we were able to begin to clean out the disc space.  Once we had cleaned out the majority of the disc space, we then cut the lateral annulus with a cob, breaking the lateral annual attachments on the contralateral side by subtly working the cob through the annulus while using flouroscopy.  Care was taken not to extend further than required after cutting the annular attachments.  After this had been performed, we prepared the endplates for placement of our graft, sized a graft to the disc space by serially dilating up in trial sizes until we confirmed that our graft would be well positioned, allowing distraction while maintaining good grip.  This was confirmed under A/P and lateral  fluoroscopy in order to ensure its placement as an eventual trial for placement of our final graft.  We irrigated with saline.  Once confirmed placement, the Hedron implant filled with allograft was impacted into position at L2/3.   Through a combination of intradiscal distraction and anterior releasing, we were able to correct the anterior deformity during disc preparation and placement of the graft.  At this point, final radiographs were performed, and we began closure.  The wound was closed using 0 Vicryl interrupted suture in the fascia and 2-0 Vicryl inverted suture were placed in the subcutaneous tissue and dermis. 3-0 monocryl was used for final closure. Dermabond was used to close the skin.    After closing the anterior part in layers, the patient was repositioned into prone position.  All pressure points were checked and double-checked.  The posterior operative site was prepped and draped in standard fashion.  The stereotactic array was placed.  Stereotactic images were acquired using intraoperative CT scanning.  This was registered to the patient.  Using stereotaxis, screw trajectories were planned and incisions made.  The pedicles from L2 to L3 were cannulated bilaterally and K wires used to secure the tracks.  We then utilized a stereotactic screwdriver to place pedicle screws from L2 to L3.  At each level, Nuvasive Reline pedicle screws were placed.  Once the screws were placed, the screw extensions were then linked, a path was formed for the rod and a rod was utilized to connect the screws.  We then compressed, torqued / counter-torqued  and removed the screw assembly. Once performed on each side, the C-arm was brought back and to take confirmatory CT scan showing appropriate placement of all instrumentation and anatomic alignment.    Posterolateral arthrodesis was performed at L2-L3 utilizing demineralized bone matrix.  We irrigated each incision and obtained hemostasis. Each wound was  closed using 0 Vicryl interrupted suture in the fascia, 2-0 Vicryl inverted suture were placed in the subcutaneous tissue and dermis. 3-0 monocryl was used for final closure. Dermabond was used to close the skin.    We then moved to the decompression.  Using the brainlab system, an incision was chosen. The metrx tubes were sequentially advanced and confirmed in position at L3/4. An 18mm by 70mm tube was locked in place to the bed side attachment.  Fluoroscopy was then removed from the field.  The microscope was then sterilely brought into the field and muscle creep was hemostased with a bipolar and resected with a pituitary rongeur.  A Bovie extender was then used to expose the spinous process and lamina.  Careful attention was placed to not violate the facet capsule. A 3 mm matchstick drill bit was then used to make a hemi-laminotomy trough until the ligamentum flavum was exposed.  This was extended to the base of the spinous process and to the contralateral side to remove all the central bone from each side.  Once this was complete and the underlying ligamentum flavum was visualized, it was dissected with a curette and resected with Kerrison rongeurs.  Extensive ligamentum hypertrophy was noted, requiring a substantial amount of time and care for removal.  The dura was identified and palpated. The kerrison rongeur was then used to remove the medial facet bilaterally until no compression was noted.  A balltip probe was used to confirm decompression of the right L4 nerve root.  Additional attention was paid to completion of the contralateral L3-4 foraminotomy until the left L4 nerve root was completely free.  Once this was complete, L3-4 central decompression including medial facetectomy and foraminotomy was confirmed and decompression on both sides was confirmed. No CSF leak was noted.  After performing the decompression at L3-4, the metrx tubes were sequentially advanced and confirmed in position at L4-5.  An 18mm by 70mm tube was locked in place to the bed side attachment.  Fluoroscopy was then removed from the field.  The microscope was then sterilely brought into the field and muscle creep was hemostased with a bipolar and resected with a pituitary rongeur.  A Bovie extender was then used to expose the spinous process and lamina.  Careful attention was placed to not violate the facet capsule. A 3 mm matchstick drill bit was then used to make a hemi-laminotomy trough until the ligamentum flavum was exposed.  This was extended to the base of the spinous process and to the contralateral side to remove all the central bone from each side.  Once this was complete and the underlying ligamentum flavum was visualized, it was dissected with a curette and resected with Kerrison rongeurs.  Extensive ligamentum hypertrophy was noted, requiring a substantial amount of time and care for removal.  The dura was identified and palpated. The kerrison rongeur was then used to remove the medial facet bilaterally until no compression was noted.  A balltip probe was used to confirm decompression of the ipsilateral L5 nerve root.  Additional attention was paid to completion of the contralateral foraminotomy until the contralateral L5 nerve root was completely free.  Once this was  complete, L4-5 central decompression including medial facetectomy and foraminotomy was confirmed and decompression on both sides was confirmed. No CSF leak was noted.  The incision was irrigated and closed in layers.  A drain was placed prior to closure.  Dermabond was placed on the incision.  Needle, lap and all counts were correct at the end of the case.     Edsel Goods PA assisted in the entire procedure. An assistant was required for this procedure due to the complexity.  The assistant provided assistance in tissue manipulation and suction, and was required for the successful and safe performance of the procedure. I performed the critical  portions of the procedure.   Reeves Daisy MD Neurosurgery

## 2024-01-03 NOTE — Anesthesia Procedure Notes (Signed)
 Procedure Name: Intubation Date/Time: 01/03/2024 1:54 PM  Performed by: Anice Melnick, CRNAPre-anesthesia Checklist: Patient identified, Patient being monitored, Timeout performed, Emergency Drugs available and Suction available Patient Re-evaluated:Patient Re-evaluated prior to induction Oxygen Delivery Method: Circle system utilized Preoxygenation: Pre-oxygenation with 100% oxygen Induction Type: IV induction Ventilation: Mask ventilation without difficulty Laryngoscope Size: McGrath and 4 Grade View: Grade I Tube type: Oral Tube size: 7.0 mm Number of attempts: 2 Airway Equipment and Method: Stylet Placement Confirmation: ETT inserted through vocal cords under direct vision, positive ETCO2 and breath sounds checked- equal and bilateral Secured at: 21 cm Tube secured with: Tape Dental Injury: Injury to lip  Comments: First attempt by SRNA, second by CRNA. Small lip lac to upper right lip

## 2024-01-03 NOTE — Anesthesia Preprocedure Evaluation (Signed)
 Anesthesia Evaluation  Patient identified by MRN, date of birth, ID band Patient awake    Reviewed: Allergy & Precautions, NPO status , Patient's Chart, lab work & pertinent test results  History of Anesthesia Complications Negative for: history of anesthetic complications  Airway Mallampati: III  TM Distance: >3 FB Neck ROM: full    Dental  (+) Chipped   Pulmonary neg pulmonary ROS, former smoker   Pulmonary exam normal        Cardiovascular hypertension, On Medications negative cardio ROS Normal cardiovascular exam     Neuro/Psych  PSYCHIATRIC DISORDERS Anxiety     negative neurological ROS     GI/Hepatic Neg liver ROS,GERD  ,,  Endo/Other  diabetes    Renal/GU      Musculoskeletal   Abdominal   Peds  Hematology negative hematology ROS (+)   Anesthesia Other Findings Past Medical History: No date: Anxiety No date: Arthritis No date: COVID-19 No date: GERD (gastroesophageal reflux disease) No date: Heart disease No date: History of kidney stones No date: Hyperlipidemia No date: Hypertension No date: Joint pain No date: Kidney stone No date: Pre-diabetes  Past Surgical History: 2013-2014: COLONOSCOPY 08/14/2014: HERNIA REPAIR     Comment:  umbilical hernia 05/01/2023: REPLACEMENT TOTAL KNEE; Left 04/25/1996: UPPER GI ENDOSCOPY  BMI    Body Mass Index: 33.63 kg/m      Reproductive/Obstetrics negative OB ROS                              Anesthesia Physical Anesthesia Plan  ASA: 3  Anesthesia Plan: General ETT   Post-op Pain Management: Tylenol  PO (pre-op)*, Gabapentin  PO (pre-op)*, Celebrex  PO (pre-op)*, Dilaudid  IV, Ketamine IV* and Precedex    Induction: Intravenous  PONV Risk Score and Plan: Ondansetron , Dexamethasone , Midazolam  and Treatment Shanholtzer vary due to age or medical condition  Airway Management Planned: Oral ETT  Additional Equipment:   Intra-op  Plan:   Post-operative Plan: Extubation in OR  Informed Consent: I have reviewed the patients History and Physical, chart, labs and discussed the procedure including the risks, benefits and alternatives for the proposed anesthesia with the patient or authorized representative who has indicated his/her understanding and acceptance.     Dental Advisory Given  Plan Discussed with: Anesthesiologist, CRNA and Surgeon  Anesthesia Plan Comments: (Patient consented for risks of anesthesia including but not limited to:  - adverse reactions to medications - damage to eyes, teeth, lips or other oral mucosa - nerve damage due to positioning  - sore throat or hoarseness - Damage to heart, brain, nerves, lungs, other parts of body or loss of life  Patient voiced understanding and assent.)        Anesthesia Quick Evaluation

## 2024-01-03 NOTE — Transfer of Care (Signed)
 Immediate Anesthesia Transfer of Care Note  Patient: Jose Moody  Procedure(s) Performed: ANTERIOR LATERAL LUMBAR FUSION WITH PERCUTANEOUS SCREW 1 LEVEL LUMBAR LAMINECTOMY/DECOMPRESSION MICRODISCECTOMY 2 LEVELS APPLICATION OF INTRAOPERATIVE CT SCAN  Patient Location: PACU  Anesthesia Type:General  Level of Consciousness: drowsy and patient cooperative  Airway & Oxygen Therapy: Patient Spontanous Breathing and Patient connected to face mask oxygen  Post-op Assessment: Report given to RN and Post -op Vital signs reviewed and stable  Post vital signs: stable, oral airway intact with oxygen via face mask.  Last Vitals:  Vitals Value Taken Time  BP    Temp    Pulse    Resp    SpO2      Last Pain:  Vitals:   01/03/24 1204  TempSrc: Temporal  PainSc: 10-Worst pain ever         Complications: No notable events documented.

## 2024-01-04 ENCOUNTER — Encounter: Payer: Self-pay | Admitting: Neurosurgery

## 2024-01-04 ENCOUNTER — Other Ambulatory Visit: Payer: Self-pay

## 2024-01-04 MED ORDER — ACETAMINOPHEN 500 MG PO TABS
1000.0000 mg | ORAL_TABLET | Freq: Four times a day (QID) | ORAL | 0 refills | Status: AC
  Filled 2024-01-04: qty 30, 4d supply, fill #0

## 2024-01-04 MED ORDER — DOCUSATE SODIUM 100 MG PO CAPS
100.0000 mg | ORAL_CAPSULE | Freq: Two times a day (BID) | ORAL | 0 refills | Status: DC
  Filled 2024-01-04: qty 10, 5d supply, fill #0

## 2024-01-04 MED ORDER — METHOCARBAMOL 500 MG PO TABS
500.0000 mg | ORAL_TABLET | Freq: Four times a day (QID) | ORAL | 2 refills | Status: DC | PRN
  Filled 2024-01-04: qty 90, 23d supply, fill #0

## 2024-01-04 MED ORDER — OXYCODONE HCL 5 MG PO TABS
5.0000 mg | ORAL_TABLET | ORAL | 0 refills | Status: DC | PRN
  Filled 2024-01-04: qty 40, 7d supply, fill #0

## 2024-01-04 MED ORDER — SENNA 8.6 MG PO TABS
1.0000 | ORAL_TABLET | Freq: Two times a day (BID) | ORAL | 0 refills | Status: DC
  Filled 2024-01-04: qty 120, 60d supply, fill #0

## 2024-01-04 MED ORDER — LISINOPRIL 20 MG PO TABS
ORAL_TABLET | ORAL | Status: AC
  Filled 2024-01-04: qty 1

## 2024-01-04 NOTE — Plan of Care (Signed)
  Problem: Education: Goal: Ability to verbalize activity precautions or restrictions will improve Outcome: Progressing   Problem: Activity: Goal: Ability to avoid complications of mobility impairment will improve Outcome: Progressing   Problem: Clinical Measurements: Goal: Ability to maintain clinical measurements within normal limits will improve Outcome: Progressing   Problem: Health Behavior/Discharge Planning: Goal: Identification of resources available to assist in meeting health care needs will improve Outcome: Progressing

## 2024-01-04 NOTE — Progress Notes (Signed)
 DISCHARGE NOTE:   Pt dc with IV removed and dc instructions given. Pt received  medications delivered to hospital room. Pt has no questions or concerns at this time. Pt wheeled down to medical mall entrance by staff. Pt's wife provided transportation.

## 2024-01-04 NOTE — Evaluation (Signed)
 Occupational Therapy Evaluation Patient Details Name: Jose Moody MRN: [id number] DOB: 09-Mar-1956 Today's Date: 01/04/2024   History of Present Illness   68 yo male s/p L2-L3 fusion, decompression at L3-L4 and L4-L5. PMH of smoking, HTN, anxiety, DM, HLD, L TKA.     Clinical Impressions Pt was seen for OT evaluation this date. PTA, pt resides in a one level home with 2 STE with HR. He is typically IND without AD use at home. Does report he has a Sports administrator and sock aide he knows how to use for dressing tasks.   Pt has pain at 6/10 at rest, but reports it improved with standing and positioning in recliner. He demo log roll technique to get OOB with supervision. Pt was edu on all back precautions and home modification, as well as DME/AE/AD use during ADL performance to maintain back precautions. He will have a friend staying with him for the next 4 weeks to assist with all needs. Pt demo LB dressing with supervision adhering to back precautions. He needed CGA for STS from EOB and for ~8-10 ft ambulation around the bed to the recliner. No LOB noted, good safety. Edu on getting up to walk to the bathroom when needing to urinate vs using urinal. He and friend verbalized understanding of all education and were provided handout to maximize carryover of all information. No further acute OT needs and OT to sign off in house.      If plan is discharge home, recommend the following:   A little help with walking and/or transfers;Help with stairs or ramp for entrance;Assist for transportation;Assistance with cooking/housework     Functional Status Assessment         Equipment Recommendations   None recommended by OT     Recommendations for Other Services         Precautions/Restrictions         Mobility Bed Mobility Overal bed mobility: Needs Assistance Bed Mobility: Rolling, Sidelying to Sit Rolling: Supervision Sidelying to sit: Supervision       General bed mobility  comments: edu on log roll technique and performed with increased time/effort    Transfers Overall transfer level: Needs assistance Equipment used: Rolling walker (2 wheels) Transfers: Sit to/from Stand Sit to Stand: Contact guard assist           General transfer comment: increased time/effort with cues to push up from EOB, able to ambulate around the bed to recliner using RW with CGA/SBA      Balance Overall balance assessment: Needs assistance Sitting-balance support: Feet supported Sitting balance-Leahy Scale: Good     Standing balance support: During functional activity, Bilateral upper extremity supported, Reliant on assistive device for balance Standing balance-Leahy Scale: Good                             ADL either performed or assessed with clinical judgement   ADL Overall ADL's : Needs assistance/impaired                     Lower Body Dressing: Supervision/safety;Sit to/from stand;Cueing for back precautions;Adhering to back precautions               Functional mobility during ADLs: Contact guard assist;Supervision/safety;Rolling walker (2 wheels)       Vision         Perception         Praxis  Pertinent Vitals/Pain Pain Assessment Pain Assessment: 0-10 Pain Score: 6  Pain Location: back Pain Descriptors / Indicators: Sore Pain Intervention(s): Monitored during session, Limited activity within patient's tolerance, Repositioned, Premedicated before session     Extremity/Trunk Assessment Upper Extremity Assessment Upper Extremity Assessment: Overall WFL for tasks assessed   Lower Extremity Assessment Lower Extremity Assessment: Generalized weakness   Cervical / Trunk Assessment Cervical / Trunk Assessment: Back Surgery   Communication Communication Communication: No apparent difficulties   Cognition Arousal: Alert Behavior During Therapy: WFL for tasks assessed/performed Cognition: No apparent  impairments                                       Cueing  General Comments          Exercises Other Exercises Other Exercises: Edu on role of OT in acute setting, back precautions, use of DME/AE/AD for ADL performance and modification to home environment to promote IND.   Shoulder Instructions      Home Living Family/patient expects to be discharged to:: Private residence Living Arrangements: Spouse/significant other Available Help at Discharge: Family;Available 24 hours/day Type of Home: House Home Access: Stairs to enter [employer] of Steps: 2 one to porch and one into the home Entrance Stairs-Rails: Left Home Layout: One level     Bathroom Shower/Tub: Producer, television/film/video: Handicapped height     Home Equipment: Pharmacist, hospital (2 wheels);Toilet riser;Hand held shower head;Adaptive equipment Adaptive Equipment: Reacher;Sock aid        Prior Functioning/Environment Prior Level of Function : Independent/Modified Independent;Driving             Mobility Comments: IND; denies falls ADLs Comments: IND, pt is retired    OT Problem List: Pain   OT Treatment/Interventions:        OT Goals(Current goals can be found in the care plan section)       OT Frequency:       Co-evaluation              AM-PAC OT 6 Clicks Daily Activity     Outcome Measure Help from another person eating meals?: None Help from another person taking care of personal grooming?: None Help from another person toileting, which includes using toliet, bedpan, or urinal?: None Help from another person bathing (including washing, rinsing, drying)?: A Little Help from another person to put on and taking off regular upper body clothing?: None Help from another person to put on and taking off regular lower body clothing?: None 6 Click Score: 23   End of Session Equipment Utilized During Treatment: Gait belt;Rolling walker (2  wheels) Nurse Communication: Mobility status  Activity Tolerance: Patient tolerated treatment well Patient left: in chair;with family/visitor present  OT Visit Diagnosis: Other abnormalities of gait and mobility (R26.89)                Time: 9247-9180 OT Time Calculation (min): 27 min Charges:  OT General Charges $OT Visit: 1 Visit OT Evaluation $OT Eval Low Complexity: 1 Low OT Treatments $Self Care/Home Management : 8-22 mins Haskel Dewalt, OTR/L  01/04/24, 10:22 AM  Duwaine FORBES Saupe 01/04/2024, 9:48 AM

## 2024-01-04 NOTE — Progress Notes (Signed)
   Neurosurgery Progress Note  History: Jose Moody is s/p L2-3 XLIF, L3-5 decompression  POD1: pt doing well this morning.  Physical Exam: Vitals:   01/03/24 2353 01/04/24 0418  BP: (!) 146/88 119/67  Pulse: 84 97  Resp: 14 18  Temp: 98.4 F (36.9 C) 98.5 F (36.9 C)  SpO2: 98% 95%    AA Ox3 CNI  Strength:5/5 throughout BLE  Incisions: c/d/i  HV 105 since surgery   Data:  Other tests/results: NA  Assessment/Plan:  Jose Moody is a 68 y.o presenting with lumbar stenosis and neurogenic claudication with associated back pain s/p L2-3 XLIF, L3-5 decompression  - mobilize - pain control - DVT prophylaxis - will re-evaluate HV output this afternoon - PTOT; dispo planning pending evaluation and recommendations   Edsel Goods PA-C Department of Neurosurgery

## 2024-01-04 NOTE — Progress Notes (Signed)
   REFERRING PHYSICIAN:  Glover Lenis, Md [address] [place],  [place] [address]  DOS: 01/03/24   L2-L3 XLIF/PSF, L3-L5 decompression    HISTORY OF PRESENT ILLNESS: Jose Moody is approximately 2 weeks status post above surgery. Was given robaxin  and oxycodone  on discharge from the hospital.   Seen in ED on 01/08/24 for constipation and abdominal pain- resolved after soapsuds enema. Called next day with increased pain, he was given a medrol  dose  pack and advised to increase oxycodone  to 1-2 q 4 hours prn.   He feels like his pain is slowly improving. His preop leg numbness is gone. He feels like his walking has improved. He has expected LBP. No further issues with his bowels.   He is taking oxycodone  2 every 6 hours, tylenol , and neurontin . He is not taking robaxin .   PHYSICAL EXAMINATION:  General: Patient is well developed, well nourished, calm, collected, and in no apparent distress.   NEUROLOGICAL:  General: In no acute distress.   Awake, alert, oriented to person, place, and time.  Pupils equal round and reactive to light.  Facial tone is symmetric.     Strength:            Side Iliopsoas Quads Hamstring PF DF EHL  R 5 5 5 5 5 5   L 5 5 5 5 5 5    Incision c/d/i   ROS (Neurologic):  Negative except as noted above  IMAGING: Nothing new to review.   ASSESSMENT/PLAN:  Jose Moody is doing well s/p above surgery. Treatment options reviewed with patient and following plan made:   - I have advised the patient to lift up to 10 pounds until 6 weeks after surgery (follow up with Dr. Clois).  - Reviewed wound care.  - No bending, twisting, or lifting.  - Continue on current medications including prn oxycodone . He is taking 2 q 6 hours prn. Refill given. PMP reviewed and is appropriate.  - Continue on neurontin  and prn tylenol  (knows to take less than 3000mg  per day).  - Follow up as scheduled in 4 weeks and prn.   Advised to contact the office if any questions or  concerns arise.  Glade Boys PA-C Department of neurosurgery

## 2024-01-04 NOTE — Evaluation (Addendum)
 Physical Therapy Evaluation Patient Details Name: Kyson Kupper Lehnen MRN: [id number] DOB: 08/13/1955 Today's Date: 01/04/2024  History of Present Illness  68 yo male s/p L2-L3 fusion, decompression at L3-L4 and L4-L5. PMH of smoking, HTN, anxiety, DM, HLD, L TKA.  Clinical Impression  Pt was met in restroom. A&Ox4 and agreeable to PT evaluation, reported 7/10 pain. Per pt at baseline is independent, lives with his spouse.  Pt able to tolerate ambulation of ~253ft with RW and CGA at slowed cadence. Stairs performed in session with CGA, L hand rail, and cues for step pattern when ascending/descending. Pt and wife requested outpatient PT at [facility] when pt is d/c- RN/MD informed. Overall the patient demonstrated deficits (see PT Problem List) that impede the patient's functional abilities, safety, and mobility and would benefit from skilled PT intervention.        If plan is discharge home, recommend the following: A little help with walking and/or transfers;A little help with bathing/dressing/bathroom;Assistance with cooking/housework;Assist for transportation;Help with stairs or ramp for entrance   Can travel by private vehicle        Equipment Recommendations None recommended by PT  Recommendations for Other Services       Functional Status Assessment Patient has had a recent decline in their functional status and demonstrates the ability to make significant improvements in function in a reasonable and predictable amount of time.     Precautions / Restrictions Precautions Precautions: Back;Fall Precaution Booklet Issued: No Recall of Precautions/Restrictions: Intact Precaution/Restrictions Comments: Pt able to recall 3/3 BLT precautions with cueing Restrictions Weight Bearing Restrictions Per Provider Order: No      Mobility  Bed Mobility                    Transfers Overall transfer level: Needs assistance Equipment used: Rolling walker (2 wheels) Transfers: Sit  to/from Stand Sit to Stand: Supervision           General transfer comment: stand > sit into recliner with supervision, cues for RW positioning and hand placement on armrests to control descent to sitting    Ambulation/Gait Ambulation/Gait assistance: Contact guard assist Gait Distance (Feet): 250 Feet Assistive device: Rolling walker (2 wheels) Gait Pattern/deviations: Step-through pattern, Decreased step length - right, Decreased step length - left, Wide base of support       General Gait Details: Pt cued throughout amb for RW positioning to maintain upright trunk posture; slowed cadence throughout  Stairs Stairs: Yes Stairs assistance: Contact guard assist Stair Management: One rail Left, Step to pattern Number of Stairs: 4 General stair comments: Ascended with L hand rail to mimic home environment, pt cued to ascend with stronger LE leading and descend with weaker LE leading.  Wheelchair Mobility     Tilt Bed    Modified Rankin (Stroke Patients Only)       Balance Overall balance assessment: Needs assistance Sitting-balance support: Feet supported Sitting balance-Leahy Scale: Good     Standing balance support: Bilateral upper extremity supported, Reliant on assistive device for balance Standing balance-Leahy Scale: Good Standing balance comment: heavy UE use on RW                             Pertinent Vitals/Pain Pain Assessment Pain Assessment: 0-10 Pain Score: 7  Pain Location: back Pain Descriptors / Indicators: Sore Pain Intervention(s): Monitored during session, Repositioned (RN notified)    Home Living Family/patient expects to be discharged to::  Private residence Living Arrangements: Spouse/significant other Available Help at Discharge: Family;Available 24 hours/day Type of Home: House Home Access: Stairs to enter Entrance Stairs-Rails: Left Entrance Stairs-Number of Steps: 2   Home Layout: One level Home Equipment: Clinical biochemist (2 wheels);Shower seat;Toilet riser;Hand held shower head      Prior Function Prior Level of Function : Independent/Modified Independent;Driving             Mobility Comments: pt denies previous AD use, no falls ADLs Comments: IND, pt is retired     Extremity/Trunk Assessment   Upper Extremity Assessment Upper Extremity Assessment: Overall WFL for tasks assessed    Lower Extremity Assessment Lower Extremity Assessment: Generalized weakness RLE Deficits / Details: hip flexion 4/5; knee extension, ankle DF/PF 5/5 RLE Sensation: WNL LLE Deficits / Details: hip flexion 4/5; knee extension, ankle DF/PF 5/5 LLE Sensation: WNL    Cervical / Trunk Assessment Cervical / Trunk Assessment: Back Surgery  Communication   Communication Communication: No apparent difficulties    Cognition Arousal: Alert Behavior During Therapy: WFL for tasks assessed/performed   PT - Cognitive impairments: No apparent impairments                         Following commands: Intact       Cueing Cueing Techniques: Verbal cues     General Comments      Exercises     Assessment/Plan    PT Assessment Patient needs continued PT services  PT Problem List Decreased strength;Decreased range of motion;Decreased activity tolerance;Decreased balance;Decreased mobility;Pain       PT Treatment Interventions DME instruction;Gait training;Stair training;Functional mobility training;Therapeutic activities;Therapeutic exercise;Balance training;Neuromuscular re-education;Patient/family education;Modalities    PT Goals (Current goals can be found in the Care Plan section)  Acute Rehab PT Goals Patient Stated Goal: return home and go to [facility] for OPPT PT Goal Formulation: With patient Time For Goal Achievement: 01/18/24 Potential to Achieve Goals: Good    Frequency 7X/week     Co-evaluation               AM-PAC PT 6 Clicks Mobility  Outcome Measure Help needed  turning from your back to your side while in a flat bed without using bedrails?: A Little Help needed moving from lying on your back to sitting on the side of a flat bed without using bedrails?: A Little Help needed moving to and from a bed to a chair (including a wheelchair)?: A Little Help needed standing up from a chair using your arms (e.g., wheelchair or bedside chair)?: A Little Help needed to walk in hospital room?: A Little Help needed climbing 3-5 steps with a railing? : A Little 6 Click Score: 18    End of Session Equipment Utilized During Treatment: Gait belt Activity Tolerance: Patient tolerated treatment well Patient left: in chair;with call bell/phone within reach;with family/visitor present Nurse Communication: Mobility status;Other (comment) (pt current pain level, requesting OPPT at d/c) PT Visit Diagnosis: Other abnormalities of gait and mobility (R26.89);Muscle weakness (generalized) (M62.81);Difficulty in walking, not elsewhere classified (R26.2);Pain Pain - Right/Left:  (midline) Pain - part of body:  (back)    Time: 9143-9086 PT Time Calculation (min) (ACUTE ONLY): 17 min   Charges:   PT Evaluation $PT Eval Low Complexity: 1 Low PT Treatments $Therapeutic Activity: 8-22 mins PT General Charges $$ ACUTE PT VISIT: 1 Visit         Janell Axe, SPT

## 2024-01-05 NOTE — Discharge Summary (Signed)
 Discharge Summary  Patient ID: Jose Moody MRN: [id number] DOB/AGE: 02/10/1956 68 y.o.  Admit date: 01/03/2024 Discharge date: 01/05/2024  Admission Diagnoses: M43.10 Retrolisthesis, M54.42, M54.41, G89.29 Chronic bilateral low back pain with bilateral sciatica, M48.062 Lumbar stenosis with neurogenic claudication   Discharge Diagnoses:  Principal Problem:   S/P lumbar fusion Active Problems:   Retrolisthesis   Chronic bilateral low back pain with bilateral sciatica   Lumbar stenosis with neurogenic claudication   Discharged Condition: good  Hospital Course:  Jose Moody is a 68 y.o presenting with the above diagnoses s/p right L2-3 XLIF and PSF with L3-5 decompression. His intraoperative course was uncomplicated. He was admitted overnight for drain monitoring and therapy evaluation. He was seen by therapy and deemed appropriate for discharge home on POD1. His drain output decreased to an acceptable level and his drain was removed on the afternoon of POD1. His pain was well controlled and he was discharged home with medications for pain.  Consults: None  Significant Diagnostic Studies: NA  Treatments: surgery: as above. Please see separately dictated operative report for further details   Discharge Exam: Blood pressure 115/64, pulse 79, temperature 98.1 F (36.7 C), temperature source Oral, resp. rate 15, height 6' (1.829 m), weight 112.5 kg, SpO2 98%.   AA Ox3 CNI   Strength:5/5 throughout BLE   Incisions: c/d/i  Disposition: Discharge disposition: 01-Home or Self Care       Discharge Instructions     Diet - low sodium heart healthy   Complete by: As directed    If the dressing is still on your incision site when you go home, remove it on the third day after your surgery date. Remove dressing if it begins to fall off, or if it is dirty or damaged before the third day.   Complete by: As directed    Incentive spirometry RT   Complete by: As directed    Increase  activity slowly   Complete by: As directed       Allergies as of 01/04/2024   No Known Allergies      Medication List     STOP taking these medications    indomethacin  50 MG capsule Commonly known as: INDOCIN    Voltaren  Arthritis Pain 1 % Gel Generic drug: diclofenac  Sodium       TAKE these medications    Acetaminophen  Extra Strength 500 MG Tabs Take 2 tablets (1,000 mg total) by mouth every 6 (six) hours.   allopurinol  100 MG tablet Commonly known as: ZYLOPRIM  Take 1 tablet (100 mg total) by mouth daily.   ALPRAZolam  0.25 MG tablet Commonly known as: XANAX  Take 1 tablet (0.25 mg total) by mouth 2 (two) times daily as needed for anxiety. What changed: additional instructions   atorvastatin  80 MG tablet Commonly known as: LIPITOR  Take 1 tablet (80 mg total) by mouth daily.   CENTRUM SILVER ADULT 50+ PO Take 1 tablet by mouth daily.   cholecalciferol 25 MCG (1000 UNIT) tablet Commonly known as: VITAMIN D3 Take 1,000 Units by mouth daily.   docusate sodium  100 MG capsule Commonly known as: COLACE Take 1 capsule (100 mg total) by mouth 2 (two) times daily.   gabapentin  300 MG capsule Commonly known as: NEURONTIN  Take 1 capsule (300 mg total) by mouth in the morning AND 1 capsule (300 mg total) daily with lunch AND 2 capsules (600 mg total) at bedtime. 1 capsule in the morning, 1 capsule at lunch, 2 capsules at bedtime.   lisinopril -hydrochlorothiazide  20-25  MG tablet Commonly known as: ZESTORETIC  TAKE 1 TABLET BY MOUTH DAILY   methocarbamol  500 MG tablet Commonly known as: ROBAXIN  Take 1 tablet (500 mg total) by mouth every 6 (six) hours as needed for muscle spasms. What changed: when to take this   omeprazole  20 MG capsule Commonly known as: PRILOSEC TAKE 1 CAPSULE BY MOUTH ONCE DAILY   oxyCODONE  5 MG immediate release tablet Commonly known as: Oxy IR/ROXICODONE  Take 1 tablet (5 mg total) by mouth every 4 (four) hours as needed for moderate pain  (pain score 4-6).   senna 8.6 MG Tabs tablet Commonly known as: SENOKOT Take 1 tablet (8.6 mg total) by mouth 2 (two) times daily.   zolpidem  10 MG tablet Commonly known as: AMBIEN  Take 1 tablet (10 mg total) by mouth at bedtime as needed for sleep. What changed: how much to take               Discharge Care Instructions  (From admission, onward)           Start     Ordered   01/04/24 0000  If the dressing is still on your incision site when you go home, remove it on the third day after your surgery date. Remove dressing if it begins to fall off, or if it is dirty or damaged before the third day.        01/04/24 1417             Signed: Edsel Jama Goods 01/05/2024, 10:45 AM

## 2024-01-07 ENCOUNTER — Emergency Department

## 2024-01-07 ENCOUNTER — Other Ambulatory Visit: Payer: Self-pay

## 2024-01-07 DIAGNOSIS — T402X5A Adverse effect of other opioids, initial encounter: Secondary | ICD-10-CM | POA: Diagnosis not present

## 2024-01-07 DIAGNOSIS — K5903 Drug induced constipation: Secondary | ICD-10-CM | POA: Insufficient documentation

## 2024-01-07 DIAGNOSIS — K5900 Constipation, unspecified: Secondary | ICD-10-CM | POA: Diagnosis present

## 2024-01-07 NOTE — ED Triage Notes (Signed)
 Pt to ed from home via POV for constipation. Pt had mack surgery Wednesday and hasn't had a BM since. Pt had fusion here by MD Katrina. Pt has tried stool softeners, laxitives but no enema. Pt is caox4, in mild discomfort and ambulatory but placed in wheel chair.

## 2024-01-08 ENCOUNTER — Emergency Department
Admission: EM | Admit: 2024-01-08 | Discharge: 2024-01-08 | Disposition: A | Discharge status: Home / Self Care | Attending: Emergency Medicine | Admitting: Emergency Medicine

## 2024-01-08 ENCOUNTER — Telehealth: Payer: Self-pay

## 2024-01-08 DIAGNOSIS — Z981 Arthrodesis status: Secondary | ICD-10-CM

## 2024-01-08 DIAGNOSIS — K5903 Drug induced constipation: Secondary | ICD-10-CM

## 2024-01-08 DIAGNOSIS — M4310 Spondylolisthesis, site unspecified: Secondary | ICD-10-CM

## 2024-01-08 NOTE — Telephone Encounter (Signed)
 Pt went to ER today and was given an enema.

## 2024-01-08 NOTE — Telephone Encounter (Signed)
 SABRA

## 2024-01-08 NOTE — ED Provider Notes (Signed)
 [facility] Provider Note    Event Date/Time   First MD Initiated Contact with Patient 01/08/24 0045     (approximate)   History   Constipation   HPI  Jose Moody is a 68 y.o. male   Past medical history of recent lumbar fusion surgery on August 6 and was started on oxycodone  who presents to the [facility] with constipation abdominal discomfort.  He feels very backed up has not made a good bowel movement since his surgery.  He has passed some small hard stool.  He has been passing flatus.  He feels diffusely uncomfortable throughout his abdomen but nothing focal.  She has not been vomiting.  He reports no urinary symptoms.  No fevers or chills.  He reports no incontinence or motor or sensory changes.  Independent Historian contributed to assessment above: Wife corroborates information above  External Medical Documents Reviewed: Surgery note for lumbar fusion      Physical Exam   Triage Vital Signs: ED Triage Vitals [01/07/24 2215]  Encounter Vitals Group     BP (!) 149/81     Girls Systolic BP Percentile      Girls Diastolic BP Percentile      Boys Systolic BP Percentile      Boys Diastolic BP Percentile      Pulse Rate (!) 105     Resp 18     Temp 97.7 F (36.5 C)     Temp Source Oral     SpO2 98 %     Weight      Height      Head Circumference      Peak Flow      Pain Score 10     Pain Loc      Pain Education      Exclude from Growth Chart     Most recent vital signs: Vitals:   01/07/24 2215  BP: (!) 149/81  Pulse: (!) 105  Resp: 18  Temp: 97.7 F (36.5 C)  SpO2: 98%    General: Awake, no distress.  CV:  Good peripheral perfusion.  Resp:  Normal effort.  Abd:  No distention. Other:  Appropriately healing noninfected appearing scars.  Nonperitoneal nonfocal abdominal exam with mild diffuse tenderness.  Rectal exam without large stool ball that would be amenable for disimpaction manually, brown stool no blood.   ED  Results / Procedures / Treatments   Labs (all labs ordered are listed, but only abnormal results are displayed) Labs Reviewed - No data to display   RADIOLOGY I independently reviewed and interpreted abdominal x-ray and see no free air I also reviewed radiologist's formal read.   PROCEDURES:  Critical Care performed: No  Procedures   MEDICATIONS ORDERED IN ED: Medications - No data to display   IMPRESSION / MDM / ASSESSMENT AND PLAN / ED COURSE  I reviewed the triage vital signs and the nursing notes.                                Patient's presentation is most consistent with acute presentation with potential threat to life or bodily function.  Differential diagnosis includes, but is not limited to, constipation, obstruction, fecal impaction   MDM:    Likely narcotic induced constipation and unlikely complete obstruction as he is passing gas and had a small bowel movement early in nonperitoneal nonfocal exam rules against other surgical abdominal pathologies  at this time.  Rectal exam shows brown stool no bleeding and nothing amenable for manual disimpaction.  He was given a soapsuds enema and had a large bowel movement feels much better.  He is ready go home.  Discharge       FINAL CLINICAL IMPRESSION(S) / ED DIAGNOSES   Final diagnoses:  Drug-induced constipation     Rx / DC Orders   ED Discharge Orders     None        Note:  This document was prepared using Dragon voice recognition software and Screws include unintentional dictation errors.    Cyrena Mylar, MD 01/08/24 [phone]

## 2024-01-08 NOTE — ED Notes (Signed)
 Pt given soap suds enema. Enema successful.

## 2024-01-08 NOTE — ED Notes (Signed)
Patient left before discharge vitals could be obtained.

## 2024-01-08 NOTE — Discharge Instructions (Signed)
 Continue taking your laxatives as prescribed by your doctor and limit the use of the narcotic medication if possible.  Thank you for choosing us  for your health care today!  Please see your primary doctor this week for a follow up appointment.   If you have any new, worsening, or unexpected symptoms call your doctor right away or come back to the emergency department for reevaluation.  It was my pleasure to care for you today.   Ginnie EDISON Cyrena, MD

## 2024-01-09 MED ORDER — METHYLPREDNISOLONE 4 MG PO TBPK
ORAL_TABLET | ORAL | 0 refills | Status: AC
Start: 2024-01-09 — End: ?

## 2024-01-09 NOTE — Telephone Encounter (Signed)
 Patient called, states he didn't get a chance to read your message but he is in excruciating pain even after leaving the ER. Please call patient back to discuss.   Only said it was hard to sleep because the pain is so bad.

## 2024-01-09 NOTE — Addendum Note (Signed)
 Addended byBETHA HILMA HASTINGS on: 01/09/2024 09:50 AM   Modules accepted: Orders

## 2024-01-09 NOTE — Telephone Encounter (Signed)
 I spoke with the patient. He reports pain in his back, both thighs and legs and difficulty sleeping. Pain is worse than before surgery. He denies new/worsening weakness in his legs or new bowel/bladder incontinence.  His spouse reports that his incision is swollen. He denies drainage. They are going to try to send a picture.  He went to the ER yesterday for an enema. He used the bathroom this morning. He is continuing to take a stool softener and metamucil.  He is taking: Tylenol  1000mg  every 6 hours Gabapentin  300mg  in the morning, 300mg  at lunch, 600mg  at bedtime Methocarbamol  500mg  every 6 hours Oxycodone  5mg  every 4 hours (he has 21 tablets left)   His pharmacy is [facility] in [place].

## 2024-01-09 NOTE — Telephone Encounter (Signed)
 His incisions look okay. I sent a message back.

## 2024-01-09 NOTE — Telephone Encounter (Signed)
 SABRA

## 2024-01-09 NOTE — Telephone Encounter (Signed)
 He is s/p right L2-3 XLIF and PSF with L3-5 decompression 01/03/24.   Recommend adding a medrol  dose pack and increasing his oxycodone  to 1-2 q 4 hours prn severe pain as needed.   Will send medrol  dose pack to pharmacy. He should take as directed.

## 2024-01-11 ENCOUNTER — Telehealth: Payer: Self-pay | Admitting: Neurosurgery

## 2024-01-11 NOTE — Telephone Encounter (Signed)
 Prescription Request  01/11/2024  LOV: 12/12/2023  What is the name of the medication or equipment? oxyCODONE  (OXY IR/ROXICODONE ) 5 MG immediate release tablet   Have you contacted your pharmacy to request a refill? No   Which pharmacy would you like this sent to?  [facility] [address] [facility] - [place], [place] - [address] [facility] [address] [place] [address] Phone: [phone] Fax: [phone]  [facility] [address] [place] [address] Phone: [phone] Fax: [phone]    Patient notified that their request is being sent to the clinical staff for review and that they should receive a response within 2 business days.   Please advise at [facility] [phone]   Patient states his frequency was increased to 2 tablets by mouth. He has 3 left (2 for 9pm tonight)

## 2024-01-12 MED ORDER — OXYCODONE HCL 5 MG PO TABS: 5.0000 mg | ORAL_TABLET | ORAL | 0 refills | Status: DC | PRN

## 2024-01-12 NOTE — Telephone Encounter (Signed)
 Patient notified

## 2024-01-16 ENCOUNTER — Ambulatory Visit (INDEPENDENT_AMBULATORY_CARE_PROVIDER_SITE_OTHER): Admitting: Orthopedic Surgery

## 2024-01-16 ENCOUNTER — Encounter: Payer: Self-pay | Admitting: Orthopedic Surgery

## 2024-01-16 VITALS — BP 136/64 | Temp 97.9°F | Ht 71.5 in | Wt 238.0 lb

## 2024-01-16 DIAGNOSIS — Z981 Arthrodesis status: Secondary | ICD-10-CM

## 2024-01-16 DIAGNOSIS — M48062 Spinal stenosis, lumbar region with neurogenic claudication: Secondary | ICD-10-CM

## 2024-01-16 DIAGNOSIS — M4310 Spondylolisthesis, site unspecified: Secondary | ICD-10-CM

## 2024-01-16 MED ORDER — OXYCODONE HCL 5 MG PO TABS: 5.0000 mg | ORAL_TABLET | Freq: Four times a day (QID) | ORAL | 0 refills | Status: DC | PRN

## 2024-01-16 NOTE — Patient Instructions (Signed)
 It was nice to see you today.   I am glad that you are feeling better!   Okay to get incision wet in the shower, do not submerge in pool or hot tub.   Call if any concerns about the incision such as redness, drainage, or fever/chills.   Try to avoid bending, twisting, or lifting. You can lift up to 10 pounds until your follow up with Dr.  Clois in 4 weeks.   I sent a refill of oxycodone  to your pharmacy. Continue to take the least amount needed and only take for severe pain. Remember this medication can make you sleepy and/or constipated.   We will see you back in 4 weeks for your 6 weeks postop visit. You will need xrays prior to that visit.   Please call with any questions or concerns.   Glade Boys PA-C [phone]     The physicians and staff at [facility] at [facility] are committed to providing excellent care. You Skog receive a survey asking for feedback about your experience at our office. We value you your feedback and appreciate you taking the time to to fill it out. The [facility] leadership team is also available to discuss your experience in person, feel free to contact us  [phone].

## 2024-01-26 ENCOUNTER — Telehealth: Payer: Self-pay

## 2024-01-26 NOTE — Telephone Encounter (Signed)
 Yes, he is far enough out from surgery that he can have a flu shot.

## 2024-01-26 NOTE — Telephone Encounter (Signed)
 Patient is wanting to know if they can get a flu shot while they are healing from their surgery with Dr.Y.

## 2024-02-09 ENCOUNTER — Other Ambulatory Visit: Payer: Self-pay

## 2024-02-09 DIAGNOSIS — M47816 Spondylosis without myelopathy or radiculopathy, lumbar region: Secondary | ICD-10-CM

## 2024-02-09 DIAGNOSIS — M48062 Spinal stenosis, lumbar region with neurogenic claudication: Secondary | ICD-10-CM

## 2024-02-13 ENCOUNTER — Ambulatory Visit (INDEPENDENT_AMBULATORY_CARE_PROVIDER_SITE_OTHER): Admitting: Neurosurgery

## 2024-02-13 ENCOUNTER — Ambulatory Visit
Admission: RE | Admit: 2024-02-13 | Discharge: 2024-02-13 | Disposition: A | Discharge status: Home / Self Care | Source: Ambulatory Visit | Attending: Neurosurgery | Admitting: Neurosurgery

## 2024-02-13 ENCOUNTER — Encounter: Payer: Self-pay | Admitting: Neurosurgery

## 2024-02-13 VITALS — BP 116/72 | Temp 79.7°F | Ht 71.5 in | Wt 238.0 lb

## 2024-02-13 DIAGNOSIS — M48062 Spinal stenosis, lumbar region with neurogenic claudication: Secondary | ICD-10-CM

## 2024-02-13 DIAGNOSIS — Z981 Arthrodesis status: Secondary | ICD-10-CM

## 2024-02-13 DIAGNOSIS — M47816 Spondylosis without myelopathy or radiculopathy, lumbar region: Secondary | ICD-10-CM

## 2024-02-13 NOTE — Progress Notes (Signed)
   REFERRING PHYSICIAN:  Glover Lenis, Md [address] [place],  [place] [address]  DOS: 01/03/24   L2-L3 XLIF/PSF, L3-L5 decompression    HISTORY OF PRESENT ILLNESS: Jose Moody is status post above surgery.   He is doing extremely well.  He has minimal back pain.  He is little sore.  He is off all medications other than Tylenol .   PHYSICAL EXAMINATION:  General: Patient is well developed, well nourished, calm, collected, and in no apparent distress.   NEUROLOGICAL:  General: In no acute distress.   Awake, alert, oriented to person, place, and time.  Pupils equal round and reactive to light.  Facial tone is symmetric.     Strength:            Side Iliopsoas Quads Hamstring PF DF EHL  R 5 5 5 5 5 5   L 5 5 5 5 5 5    Incision c/d/i   ROS (Neurologic):  Negative except as noted above  IMAGING: No complications noted  ASSESSMENT/PLAN:  Jose Moody is doing well s/p above surgery.  We reviewed activity limitations.  I am very pleased with his response to surgery.  Will see him back in 6 weeks.     Reeves Daisy MD Department of neurosurgery

## 2024-03-21 ENCOUNTER — Other Ambulatory Visit: Payer: Self-pay | Admitting: Family Medicine

## 2024-03-21 DIAGNOSIS — M5416 Radiculopathy, lumbar region: Secondary | ICD-10-CM

## 2024-03-22 NOTE — Progress Notes (Signed)
   REFERRING PHYSICIAN:  Glover Lenis, Md [address] [place],  [place] [address]  DOS: 01/03/24   L2-L3 XLIF/PSF, L3-L5 decompression    HISTORY OF PRESENT ILLNESS:  He was doing very well at his last visit with minimal LBP.   He notes some intermittent soreness in his left lower back. Numbness in both legs is gone, but he still has numbness in left thigh that he had after knee surgery. Also still with numbness in both feet.   He continues on prn tylenol  and neurontin .   PHYSICAL EXAMINATION:  General: Patient is well developed, well nourished, calm, collected, and in no apparent distress.   NEUROLOGICAL:  General: In no acute distress.   Awake, alert, oriented to person, place, and time.  Pupils equal round and reactive to light.  Facial tone is symmetric.     Strength:        Side Iliopsoas Quads Hamstring PF DF EHL  R 5 5 5 5 5 5   L 5 5 5 5 5 5    Incisions well healed   ROS (Neurologic):  Negative except as noted above  IMAGING: Lumbar xrays dated 03/26/24:  No complications noted.  Report for above xrays not yet available.   ASSESSMENT/PLAN:  Stepan Verrette Stebner is doing well s/p above surgery. Treatment options reviewed with patient and following plan made:   - He can slowly return to activity as tolerated. - Follow up with Dr. Clois in 6 months and prn.   Advised to contact the office if any questions or concerns arise.  Glade Boys PA-C Department of neurosurgery

## 2024-03-26 ENCOUNTER — Ambulatory Visit: Admitting: Orthopedic Surgery

## 2024-03-26 ENCOUNTER — Encounter: Payer: Self-pay | Admitting: Orthopedic Surgery

## 2024-03-26 ENCOUNTER — Ambulatory Visit

## 2024-03-26 VITALS — BP 112/64 | Temp 97.6°F | Ht 71.5 in | Wt 238.2 lb

## 2024-03-26 DIAGNOSIS — Z981 Arthrodesis status: Secondary | ICD-10-CM

## 2024-03-26 DIAGNOSIS — M5416 Radiculopathy, lumbar region: Secondary | ICD-10-CM | POA: Diagnosis not present

## 2024-03-26 DIAGNOSIS — M48062 Spinal stenosis, lumbar region with neurogenic claudication: Secondary | ICD-10-CM

## 2024-05-01 ENCOUNTER — Other Ambulatory Visit: Payer: Self-pay | Admitting: Neurosurgery

## 2024-05-01 DIAGNOSIS — G629 Polyneuropathy, unspecified: Secondary | ICD-10-CM

## 2024-09-24 ENCOUNTER — Ambulatory Visit: Admitting: Neurosurgery
